# Patient Record
Sex: Male | Born: 1970 | Race: Black or African American | Hispanic: No | Marital: Single | Smoking: Never smoker
Health system: Southern US, Community
[De-identification: ages and names within clinical notes are randomized; demographics above are authoritative.]

## PROBLEM LIST (undated history)

## (undated) DIAGNOSIS — E119 Type 2 diabetes mellitus without complications: Secondary | ICD-10-CM

## (undated) DIAGNOSIS — F909 Attention-deficit hyperactivity disorder, unspecified type: Secondary | ICD-10-CM

## (undated) DIAGNOSIS — G4730 Sleep apnea, unspecified: Secondary | ICD-10-CM

## (undated) DIAGNOSIS — I10 Essential (primary) hypertension: Secondary | ICD-10-CM

## (undated) DIAGNOSIS — M109 Gout, unspecified: Secondary | ICD-10-CM

## (undated) DIAGNOSIS — F419 Anxiety disorder, unspecified: Secondary | ICD-10-CM

## (undated) HISTORY — DX: Sleep apnea, unspecified: G47.30

## (undated) HISTORY — PX: WISDOM TOOTH EXTRACTION: SHX21

## (undated) HISTORY — DX: Anxiety disorder, unspecified: F41.9

## (undated) HISTORY — PX: GASTRIC BYPASS: SHX52

---

## 1999-02-05 ENCOUNTER — Emergency Department (HOSPITAL_COMMUNITY): Admission: EM | Admit: 1999-02-05 | Discharge: 1999-02-05 | Payer: Self-pay | Admitting: *Deleted

## 2003-06-28 ENCOUNTER — Emergency Department (HOSPITAL_COMMUNITY): Admission: EM | Admit: 2003-06-28 | Discharge: 2003-06-28 | Payer: Self-pay | Admitting: Emergency Medicine

## 2011-05-28 DIAGNOSIS — F411 Generalized anxiety disorder: Secondary | ICD-10-CM | POA: Diagnosis not present

## 2011-05-28 DIAGNOSIS — F39 Unspecified mood [affective] disorder: Secondary | ICD-10-CM | POA: Diagnosis not present

## 2011-05-28 DIAGNOSIS — F988 Other specified behavioral and emotional disorders with onset usually occurring in childhood and adolescence: Secondary | ICD-10-CM | POA: Diagnosis not present

## 2011-07-09 DIAGNOSIS — F259 Schizoaffective disorder, unspecified: Secondary | ICD-10-CM | POA: Diagnosis not present

## 2011-07-09 DIAGNOSIS — F988 Other specified behavioral and emotional disorders with onset usually occurring in childhood and adolescence: Secondary | ICD-10-CM | POA: Diagnosis not present

## 2011-07-21 DIAGNOSIS — M545 Low back pain, unspecified: Secondary | ICD-10-CM | POA: Diagnosis not present

## 2011-07-21 DIAGNOSIS — K8020 Calculus of gallbladder without cholecystitis without obstruction: Secondary | ICD-10-CM | POA: Diagnosis not present

## 2011-07-21 DIAGNOSIS — E559 Vitamin D deficiency, unspecified: Secondary | ICD-10-CM | POA: Diagnosis not present

## 2011-07-21 DIAGNOSIS — G4733 Obstructive sleep apnea (adult) (pediatric): Secondary | ICD-10-CM | POA: Diagnosis not present

## 2011-07-21 DIAGNOSIS — F988 Other specified behavioral and emotional disorders with onset usually occurring in childhood and adolescence: Secondary | ICD-10-CM | POA: Diagnosis not present

## 2011-07-21 DIAGNOSIS — F329 Major depressive disorder, single episode, unspecified: Secondary | ICD-10-CM | POA: Diagnosis not present

## 2011-07-21 DIAGNOSIS — D518 Other vitamin B12 deficiency anemias: Secondary | ICD-10-CM | POA: Diagnosis not present

## 2011-07-21 DIAGNOSIS — I10 Essential (primary) hypertension: Secondary | ICD-10-CM | POA: Diagnosis not present

## 2011-08-12 DIAGNOSIS — F988 Other specified behavioral and emotional disorders with onset usually occurring in childhood and adolescence: Secondary | ICD-10-CM | POA: Diagnosis not present

## 2011-08-12 DIAGNOSIS — F331 Major depressive disorder, recurrent, moderate: Secondary | ICD-10-CM | POA: Diagnosis not present

## 2011-08-13 DIAGNOSIS — I10 Essential (primary) hypertension: Secondary | ICD-10-CM | POA: Diagnosis not present

## 2011-08-13 DIAGNOSIS — F988 Other specified behavioral and emotional disorders with onset usually occurring in childhood and adolescence: Secondary | ICD-10-CM | POA: Diagnosis not present

## 2011-08-13 DIAGNOSIS — F331 Major depressive disorder, recurrent, moderate: Secondary | ICD-10-CM | POA: Diagnosis not present

## 2011-09-10 DIAGNOSIS — F39 Unspecified mood [affective] disorder: Secondary | ICD-10-CM | POA: Diagnosis not present

## 2011-09-10 DIAGNOSIS — F909 Attention-deficit hyperactivity disorder, unspecified type: Secondary | ICD-10-CM | POA: Diagnosis not present

## 2011-09-16 DIAGNOSIS — J309 Allergic rhinitis, unspecified: Secondary | ICD-10-CM | POA: Diagnosis not present

## 2011-09-16 DIAGNOSIS — F329 Major depressive disorder, single episode, unspecified: Secondary | ICD-10-CM | POA: Diagnosis not present

## 2011-09-16 DIAGNOSIS — I10 Essential (primary) hypertension: Secondary | ICD-10-CM | POA: Diagnosis not present

## 2011-09-16 DIAGNOSIS — F909 Attention-deficit hyperactivity disorder, unspecified type: Secondary | ICD-10-CM | POA: Diagnosis not present

## 2011-09-18 DIAGNOSIS — F39 Unspecified mood [affective] disorder: Secondary | ICD-10-CM | POA: Diagnosis not present

## 2011-09-30 DIAGNOSIS — M549 Dorsalgia, unspecified: Secondary | ICD-10-CM | POA: Diagnosis not present

## 2011-09-30 DIAGNOSIS — E348 Other specified endocrine disorders: Secondary | ICD-10-CM | POA: Diagnosis not present

## 2011-09-30 DIAGNOSIS — M542 Cervicalgia: Secondary | ICD-10-CM | POA: Diagnosis not present

## 2011-09-30 DIAGNOSIS — F39 Unspecified mood [affective] disorder: Secondary | ICD-10-CM | POA: Diagnosis not present

## 2011-09-30 DIAGNOSIS — E669 Obesity, unspecified: Secondary | ICD-10-CM | POA: Diagnosis not present

## 2011-09-30 DIAGNOSIS — I10 Essential (primary) hypertension: Secondary | ICD-10-CM | POA: Diagnosis not present

## 2011-10-08 DIAGNOSIS — E559 Vitamin D deficiency, unspecified: Secondary | ICD-10-CM | POA: Diagnosis not present

## 2011-10-08 DIAGNOSIS — R609 Edema, unspecified: Secondary | ICD-10-CM | POA: Diagnosis not present

## 2011-10-08 DIAGNOSIS — F319 Bipolar disorder, unspecified: Secondary | ICD-10-CM | POA: Diagnosis not present

## 2011-10-08 DIAGNOSIS — M109 Gout, unspecified: Secondary | ICD-10-CM | POA: Diagnosis not present

## 2011-10-08 DIAGNOSIS — I10 Essential (primary) hypertension: Secondary | ICD-10-CM | POA: Diagnosis not present

## 2011-10-08 DIAGNOSIS — K8020 Calculus of gallbladder without cholecystitis without obstruction: Secondary | ICD-10-CM | POA: Diagnosis not present

## 2011-10-08 DIAGNOSIS — E291 Testicular hypofunction: Secondary | ICD-10-CM | POA: Diagnosis not present

## 2011-10-08 DIAGNOSIS — G4730 Sleep apnea, unspecified: Secondary | ICD-10-CM | POA: Diagnosis not present

## 2011-10-16 DIAGNOSIS — F39 Unspecified mood [affective] disorder: Secondary | ICD-10-CM | POA: Diagnosis not present

## 2011-10-16 DIAGNOSIS — F259 Schizoaffective disorder, unspecified: Secondary | ICD-10-CM | POA: Diagnosis not present

## 2011-10-21 DIAGNOSIS — F259 Schizoaffective disorder, unspecified: Secondary | ICD-10-CM | POA: Diagnosis not present

## 2011-10-21 DIAGNOSIS — F39 Unspecified mood [affective] disorder: Secondary | ICD-10-CM | POA: Diagnosis not present

## 2011-11-03 DIAGNOSIS — E559 Vitamin D deficiency, unspecified: Secondary | ICD-10-CM | POA: Diagnosis not present

## 2011-11-03 DIAGNOSIS — I10 Essential (primary) hypertension: Secondary | ICD-10-CM | POA: Diagnosis not present

## 2011-11-03 DIAGNOSIS — R404 Transient alteration of awareness: Secondary | ICD-10-CM | POA: Diagnosis not present

## 2011-11-03 DIAGNOSIS — G4730 Sleep apnea, unspecified: Secondary | ICD-10-CM | POA: Diagnosis not present

## 2011-11-03 DIAGNOSIS — F319 Bipolar disorder, unspecified: Secondary | ICD-10-CM | POA: Diagnosis not present

## 2011-11-03 DIAGNOSIS — F909 Attention-deficit hyperactivity disorder, unspecified type: Secondary | ICD-10-CM | POA: Diagnosis not present

## 2011-11-03 DIAGNOSIS — R609 Edema, unspecified: Secondary | ICD-10-CM | POA: Diagnosis not present

## 2011-11-17 DIAGNOSIS — F909 Attention-deficit hyperactivity disorder, unspecified type: Secondary | ICD-10-CM | POA: Diagnosis not present

## 2011-11-17 DIAGNOSIS — I10 Essential (primary) hypertension: Secondary | ICD-10-CM | POA: Diagnosis not present

## 2011-11-17 DIAGNOSIS — G4730 Sleep apnea, unspecified: Secondary | ICD-10-CM | POA: Diagnosis not present

## 2011-11-17 DIAGNOSIS — R5381 Other malaise: Secondary | ICD-10-CM | POA: Diagnosis not present

## 2011-11-17 DIAGNOSIS — E291 Testicular hypofunction: Secondary | ICD-10-CM | POA: Diagnosis not present

## 2011-11-17 DIAGNOSIS — E559 Vitamin D deficiency, unspecified: Secondary | ICD-10-CM | POA: Diagnosis not present

## 2011-11-17 DIAGNOSIS — R5383 Other fatigue: Secondary | ICD-10-CM | POA: Diagnosis not present

## 2011-11-28 DIAGNOSIS — F988 Other specified behavioral and emotional disorders with onset usually occurring in childhood and adolescence: Secondary | ICD-10-CM | POA: Diagnosis not present

## 2011-11-28 DIAGNOSIS — E291 Testicular hypofunction: Secondary | ICD-10-CM | POA: Diagnosis not present

## 2011-11-28 DIAGNOSIS — E559 Vitamin D deficiency, unspecified: Secondary | ICD-10-CM | POA: Diagnosis not present

## 2011-11-28 DIAGNOSIS — G4730 Sleep apnea, unspecified: Secondary | ICD-10-CM | POA: Diagnosis not present

## 2011-11-28 DIAGNOSIS — I10 Essential (primary) hypertension: Secondary | ICD-10-CM | POA: Diagnosis not present

## 2011-12-31 DIAGNOSIS — I10 Essential (primary) hypertension: Secondary | ICD-10-CM | POA: Diagnosis not present

## 2011-12-31 DIAGNOSIS — E559 Vitamin D deficiency, unspecified: Secondary | ICD-10-CM | POA: Diagnosis not present

## 2011-12-31 DIAGNOSIS — T465X1A Poisoning by other antihypertensive drugs, accidental (unintentional), initial encounter: Secondary | ICD-10-CM | POA: Diagnosis not present

## 2011-12-31 DIAGNOSIS — R351 Nocturia: Secondary | ICD-10-CM | POA: Diagnosis not present

## 2011-12-31 DIAGNOSIS — R609 Edema, unspecified: Secondary | ICD-10-CM | POA: Diagnosis not present

## 2011-12-31 DIAGNOSIS — F909 Attention-deficit hyperactivity disorder, unspecified type: Secondary | ICD-10-CM | POA: Diagnosis not present

## 2011-12-31 DIAGNOSIS — G4700 Insomnia, unspecified: Secondary | ICD-10-CM | POA: Diagnosis not present

## 2012-02-13 DIAGNOSIS — N3941 Urge incontinence: Secondary | ICD-10-CM | POA: Diagnosis not present

## 2012-02-13 DIAGNOSIS — N529 Male erectile dysfunction, unspecified: Secondary | ICD-10-CM | POA: Diagnosis not present

## 2012-03-11 DIAGNOSIS — E291 Testicular hypofunction: Secondary | ICD-10-CM | POA: Diagnosis not present

## 2012-03-11 DIAGNOSIS — R350 Frequency of micturition: Secondary | ICD-10-CM | POA: Diagnosis not present

## 2012-03-11 DIAGNOSIS — R809 Proteinuria, unspecified: Secondary | ICD-10-CM | POA: Diagnosis not present

## 2012-03-11 DIAGNOSIS — K912 Postsurgical malabsorption, not elsewhere classified: Secondary | ICD-10-CM | POA: Diagnosis not present

## 2012-04-08 DIAGNOSIS — R809 Proteinuria, unspecified: Secondary | ICD-10-CM | POA: Diagnosis not present

## 2012-04-08 DIAGNOSIS — I10 Essential (primary) hypertension: Secondary | ICD-10-CM | POA: Diagnosis not present

## 2012-04-19 DIAGNOSIS — I10 Essential (primary) hypertension: Secondary | ICD-10-CM | POA: Diagnosis not present

## 2012-04-19 DIAGNOSIS — R809 Proteinuria, unspecified: Secondary | ICD-10-CM | POA: Diagnosis not present

## 2012-04-23 DIAGNOSIS — I10 Essential (primary) hypertension: Secondary | ICD-10-CM | POA: Diagnosis not present

## 2012-04-23 DIAGNOSIS — R809 Proteinuria, unspecified: Secondary | ICD-10-CM | POA: Diagnosis not present

## 2012-05-07 DIAGNOSIS — N181 Chronic kidney disease, stage 1: Secondary | ICD-10-CM | POA: Diagnosis not present

## 2012-05-07 DIAGNOSIS — I10 Essential (primary) hypertension: Secondary | ICD-10-CM | POA: Diagnosis not present

## 2012-06-03 DIAGNOSIS — R350 Frequency of micturition: Secondary | ICD-10-CM | POA: Diagnosis not present

## 2012-06-03 DIAGNOSIS — F411 Generalized anxiety disorder: Secondary | ICD-10-CM | POA: Diagnosis not present

## 2012-06-03 DIAGNOSIS — F319 Bipolar disorder, unspecified: Secondary | ICD-10-CM | POA: Diagnosis not present

## 2012-06-03 DIAGNOSIS — N181 Chronic kidney disease, stage 1: Secondary | ICD-10-CM | POA: Diagnosis not present

## 2012-06-03 DIAGNOSIS — R82998 Other abnormal findings in urine: Secondary | ICD-10-CM | POA: Diagnosis not present

## 2012-06-03 DIAGNOSIS — J309 Allergic rhinitis, unspecified: Secondary | ICD-10-CM | POA: Diagnosis not present

## 2012-06-03 DIAGNOSIS — Z9884 Bariatric surgery status: Secondary | ICD-10-CM | POA: Diagnosis not present

## 2012-06-03 DIAGNOSIS — F909 Attention-deficit hyperactivity disorder, unspecified type: Secondary | ICD-10-CM | POA: Diagnosis not present

## 2012-06-03 DIAGNOSIS — I10 Essential (primary) hypertension: Secondary | ICD-10-CM | POA: Diagnosis not present

## 2012-06-03 DIAGNOSIS — M109 Gout, unspecified: Secondary | ICD-10-CM | POA: Diagnosis not present

## 2012-06-07 DIAGNOSIS — F259 Schizoaffective disorder, unspecified: Secondary | ICD-10-CM | POA: Diagnosis not present

## 2012-06-17 DIAGNOSIS — Z9884 Bariatric surgery status: Secondary | ICD-10-CM | POA: Diagnosis not present

## 2012-06-17 DIAGNOSIS — R809 Proteinuria, unspecified: Secondary | ICD-10-CM | POA: Diagnosis not present

## 2012-06-17 DIAGNOSIS — R82998 Other abnormal findings in urine: Secondary | ICD-10-CM | POA: Diagnosis not present

## 2012-06-17 DIAGNOSIS — N181 Chronic kidney disease, stage 1: Secondary | ICD-10-CM | POA: Diagnosis not present

## 2012-06-17 DIAGNOSIS — I10 Essential (primary) hypertension: Secondary | ICD-10-CM | POA: Diagnosis not present

## 2012-06-17 DIAGNOSIS — F259 Schizoaffective disorder, unspecified: Secondary | ICD-10-CM | POA: Diagnosis not present

## 2012-06-17 DIAGNOSIS — J309 Allergic rhinitis, unspecified: Secondary | ICD-10-CM | POA: Diagnosis not present

## 2012-06-17 DIAGNOSIS — F909 Attention-deficit hyperactivity disorder, unspecified type: Secondary | ICD-10-CM | POA: Diagnosis not present

## 2012-06-17 DIAGNOSIS — F319 Bipolar disorder, unspecified: Secondary | ICD-10-CM | POA: Diagnosis not present

## 2012-06-17 DIAGNOSIS — M109 Gout, unspecified: Secondary | ICD-10-CM | POA: Diagnosis not present

## 2012-06-17 DIAGNOSIS — F411 Generalized anxiety disorder: Secondary | ICD-10-CM | POA: Diagnosis not present

## 2012-07-05 DIAGNOSIS — Z79899 Other long term (current) drug therapy: Secondary | ICD-10-CM | POA: Diagnosis not present

## 2012-07-05 DIAGNOSIS — N189 Chronic kidney disease, unspecified: Secondary | ICD-10-CM | POA: Diagnosis not present

## 2012-07-05 DIAGNOSIS — M839 Adult osteomalacia, unspecified: Secondary | ICD-10-CM | POA: Diagnosis not present

## 2012-07-05 DIAGNOSIS — I10 Essential (primary) hypertension: Secondary | ICD-10-CM | POA: Diagnosis not present

## 2012-07-05 DIAGNOSIS — F909 Attention-deficit hyperactivity disorder, unspecified type: Secondary | ICD-10-CM | POA: Diagnosis not present

## 2012-07-05 DIAGNOSIS — K759 Inflammatory liver disease, unspecified: Secondary | ICD-10-CM | POA: Diagnosis not present

## 2012-07-05 DIAGNOSIS — F3110 Bipolar disorder, current episode manic without psychotic features, unspecified: Secondary | ICD-10-CM | POA: Diagnosis not present

## 2012-07-19 DIAGNOSIS — F4310 Post-traumatic stress disorder, unspecified: Secondary | ICD-10-CM | POA: Diagnosis not present

## 2012-07-19 DIAGNOSIS — F319 Bipolar disorder, unspecified: Secondary | ICD-10-CM | POA: Diagnosis not present

## 2012-07-26 DIAGNOSIS — R51 Headache: Secondary | ICD-10-CM | POA: Diagnosis not present

## 2012-07-26 DIAGNOSIS — F319 Bipolar disorder, unspecified: Secondary | ICD-10-CM | POA: Diagnosis not present

## 2012-07-26 DIAGNOSIS — R110 Nausea: Secondary | ICD-10-CM | POA: Diagnosis not present

## 2012-07-26 DIAGNOSIS — F22 Delusional disorders: Secondary | ICD-10-CM | POA: Diagnosis not present

## 2012-07-26 DIAGNOSIS — I10 Essential (primary) hypertension: Secondary | ICD-10-CM | POA: Diagnosis not present

## 2012-07-26 DIAGNOSIS — Z79899 Other long term (current) drug therapy: Secondary | ICD-10-CM | POA: Diagnosis not present

## 2012-07-26 DIAGNOSIS — F4310 Post-traumatic stress disorder, unspecified: Secondary | ICD-10-CM | POA: Diagnosis not present

## 2012-08-02 DIAGNOSIS — F319 Bipolar disorder, unspecified: Secondary | ICD-10-CM | POA: Diagnosis not present

## 2012-08-02 DIAGNOSIS — I10 Essential (primary) hypertension: Secondary | ICD-10-CM | POA: Diagnosis not present

## 2012-11-09 DIAGNOSIS — E559 Vitamin D deficiency, unspecified: Secondary | ICD-10-CM | POA: Diagnosis not present

## 2012-11-09 DIAGNOSIS — Z136 Encounter for screening for cardiovascular disorders: Secondary | ICD-10-CM | POA: Diagnosis not present

## 2012-11-09 DIAGNOSIS — F411 Generalized anxiety disorder: Secondary | ICD-10-CM | POA: Diagnosis not present

## 2012-11-09 DIAGNOSIS — Z1329 Encounter for screening for other suspected endocrine disorder: Secondary | ICD-10-CM | POA: Diagnosis not present

## 2012-11-09 DIAGNOSIS — I119 Hypertensive heart disease without heart failure: Secondary | ICD-10-CM | POA: Diagnosis not present

## 2012-11-09 DIAGNOSIS — N318 Other neuromuscular dysfunction of bladder: Secondary | ICD-10-CM | POA: Diagnosis not present

## 2012-11-09 DIAGNOSIS — F4323 Adjustment disorder with mixed anxiety and depressed mood: Secondary | ICD-10-CM | POA: Diagnosis not present

## 2012-11-09 DIAGNOSIS — F909 Attention-deficit hyperactivity disorder, unspecified type: Secondary | ICD-10-CM | POA: Diagnosis not present

## 2012-11-09 DIAGNOSIS — Z131 Encounter for screening for diabetes mellitus: Secondary | ICD-10-CM | POA: Diagnosis not present

## 2013-04-27 ENCOUNTER — Encounter (HOSPITAL_COMMUNITY): Payer: Self-pay | Admitting: Emergency Medicine

## 2013-04-27 ENCOUNTER — Emergency Department (HOSPITAL_COMMUNITY)
Admission: EM | Admit: 2013-04-27 | Discharge: 2013-04-27 | Disposition: A | Discharge status: Home / Self Care | Payer: Medicare Other | Attending: Emergency Medicine | Admitting: Emergency Medicine

## 2013-04-27 DIAGNOSIS — M25439 Effusion, unspecified wrist: Secondary | ICD-10-CM | POA: Insufficient documentation

## 2013-04-27 DIAGNOSIS — M109 Gout, unspecified: Secondary | ICD-10-CM | POA: Insufficient documentation

## 2013-04-27 DIAGNOSIS — Z9114 Patient's other noncompliance with medication regimen: Secondary | ICD-10-CM

## 2013-04-27 DIAGNOSIS — Z8659 Personal history of other mental and behavioral disorders: Secondary | ICD-10-CM | POA: Insufficient documentation

## 2013-04-27 DIAGNOSIS — R350 Frequency of micturition: Secondary | ICD-10-CM | POA: Insufficient documentation

## 2013-04-27 DIAGNOSIS — Z9119 Patient's noncompliance with other medical treatment and regimen: Secondary | ICD-10-CM | POA: Insufficient documentation

## 2013-04-27 DIAGNOSIS — Z91199 Patient's noncompliance with other medical treatment and regimen due to unspecified reason: Secondary | ICD-10-CM | POA: Insufficient documentation

## 2013-04-27 DIAGNOSIS — I10 Essential (primary) hypertension: Secondary | ICD-10-CM | POA: Insufficient documentation

## 2013-04-27 DIAGNOSIS — R51 Headache: Secondary | ICD-10-CM | POA: Insufficient documentation

## 2013-04-27 HISTORY — DX: Essential (primary) hypertension: I10

## 2013-04-27 HISTORY — DX: Attention-deficit hyperactivity disorder, unspecified type: F90.9

## 2013-04-27 HISTORY — DX: Gout, unspecified: M10.9

## 2013-04-27 HISTORY — DX: Type 2 diabetes mellitus without complications: E11.9

## 2013-04-27 LAB — GLUCOSE, CAPILLARY: Glucose-Capillary: 104 mg/dL — ABNORMAL HIGH (ref 70–99)

## 2013-04-27 MED ORDER — OXYCODONE-ACETAMINOPHEN 5-325 MG PO TABS: 2.0000 | ORAL_TABLET | ORAL | Status: DC | PRN

## 2013-04-27 MED ORDER — COLCHICINE 0.6 MG PO TABS: ORAL_TABLET | ORAL | Status: DC

## 2013-04-27 NOTE — ED Notes (Signed)
Pt reports ran out of adderall approx 1 week ago.  C/O headaches, dry mouth, frequent urination.  Also c/o gout in left hand x 2 days.

## 2013-04-27 NOTE — ED Provider Notes (Signed)
CSN: [id number]     Arrival date & time 04/27/13  4098 History  This chart was scribed for Charles B. Bernette Mayers, MD, by Yevette Edwards, ED Scribe. This patient was seen in room APA04/APA04 and the patient's care was started at 9:44 AM.  None    No chief complaint on file.  The history is provided by the patient. No language interpreter was used.   HPI Comments: Julian Richardson is a 42 y.o. Male, with a h/o gout, HTN, and ADHD, who presents to the Emergency Department complaining of gradually-increasing pain to his left hand which began four days ago and which has been accompanied by swelling. The pt reports he has experienced gout to same location previously. His last gout flare-up was approximately two years ago. He denies any recent falls or injuries. Called PCP and given Rx for Allopurinol 2 days ago.  The pt also reports his ADHD medications have recently been changed by his Psychiatrist.  His prescription for short-acting adderall, which he was given at the beginning of December, ran out last week, but his pharmacy informed him he could not refill the medication until January 8th. The pt reports he was provided a month's prescription, but he took extra doses because he believed the medication was not working well. He states he has also experienced a headache, dry mouth, and urinary frequency, and he questions if any of his medications are related to the symptoms. The pt denies a fever and emesis. He is a non-smoker.   No past medical history on file. No past surgical history on file. No family history on file. History  Substance Use Topics  . Smoking status: Not on file  . Smokeless tobacco: Not on file  . Alcohol Use: Not on file    Review of Systems  A complete 10 system review of systems was obtained, and all systems were negative except where indicated in the HPI and PE.   Allergies  Review of patient's allergies indicates not on file.  Home Medications  No current outpatient  prescriptions on file.  Triage Vitals: BP 180/100  Pulse 85  Temp(Src) 98.2 F (36.8 C) (Oral)  Resp 18  Ht 5\' 11"  (1.803 m)  Wt 349 lb (158.305 kg)  BMI 48.70 kg/m2  SpO2 99%  Physical Exam  Nursing note and vitals reviewed. Constitutional: He is oriented to person, place, and time. He appears well-developed and well-nourished.  HENT:  Head: Normocephalic and atraumatic.  Dry mouth.   Eyes: EOM are normal. Pupils are equal, round, and reactive to light.  Neck: Normal range of motion. Neck supple.  Cardiovascular: Normal rate, normal heart sounds and intact distal pulses.   Pulmonary/Chest: Effort normal and breath sounds normal.  Abdominal: Bowel sounds are normal. He exhibits no distension. There is no tenderness.  Musculoskeletal: Normal range of motion. He exhibits no edema and no tenderness.  Left hand and wrist swollen, warm and tender, worse with movement.  Neurological: He is alert and oriented to person, place, and time. He has normal strength. No cranial nerve deficit or sensory deficit.  Skin: Skin is warm and dry. No rash noted.  Psychiatric: He has a normal mood and affect.    ED Course  Procedures (including critical care time)  DIAGNOSTIC STUDIES: Oxygen Saturation is 99% on room air, normal by my interpretation.    COORDINATION OF CARE:  9:52 AM- Discussed treatment plan with patient, and the patient agreed to the plan.   Labs Review Labs Reviewed -  No data to display Imaging Review No results found.  EKG Interpretation   None       MDM   1. Gout attack   2. Non compliance w medication regimen    Pt advised to hold Allopurinol while having an acute gout flare. Rx Colchicine and Percocet. Advised to discuss ADHD meds with Psychiatrist.   I personally performed the services described in this documentation, which was scribed in my presence. The recorded information has been reviewed and is accurate.      Charles B. Bernette Mayers, MD 04/27/13  1017

## 2013-04-27 NOTE — ED Notes (Signed)
Patient with no complaints at this time. Respirations even and unlabored. Skin warm/dry. Discharge instructions reviewed with patient at this time. Patient given opportunity to voice concerns/ask questions. Patient discharged at this time and left Emergency Department with steady gait.   

## 2013-05-05 DIAGNOSIS — F332 Major depressive disorder, recurrent severe without psychotic features: Secondary | ICD-10-CM | POA: Diagnosis not present

## 2013-06-03 ENCOUNTER — Encounter (HOSPITAL_COMMUNITY): Payer: Self-pay | Admitting: Emergency Medicine

## 2013-06-03 ENCOUNTER — Emergency Department (HOSPITAL_COMMUNITY)
Admission: EM | Admit: 2013-06-03 | Discharge: 2013-06-03 | Disposition: A | Discharge status: Home / Self Care | Payer: Medicare Other | Attending: Emergency Medicine | Admitting: Emergency Medicine

## 2013-06-03 DIAGNOSIS — Y929 Unspecified place or not applicable: Secondary | ICD-10-CM | POA: Insufficient documentation

## 2013-06-03 DIAGNOSIS — X131XXA Other contact with steam and other hot vapors, initial encounter: Secondary | ICD-10-CM

## 2013-06-03 DIAGNOSIS — E119 Type 2 diabetes mellitus without complications: Secondary | ICD-10-CM | POA: Insufficient documentation

## 2013-06-03 DIAGNOSIS — Y939 Activity, unspecified: Secondary | ICD-10-CM | POA: Insufficient documentation

## 2013-06-03 DIAGNOSIS — M2550 Pain in unspecified joint: Secondary | ICD-10-CM | POA: Insufficient documentation

## 2013-06-03 DIAGNOSIS — I10 Essential (primary) hypertension: Secondary | ICD-10-CM | POA: Insufficient documentation

## 2013-06-03 DIAGNOSIS — Z79899 Other long term (current) drug therapy: Secondary | ICD-10-CM | POA: Insufficient documentation

## 2013-06-03 DIAGNOSIS — M109 Gout, unspecified: Secondary | ICD-10-CM | POA: Insufficient documentation

## 2013-06-03 DIAGNOSIS — T23229A Burn of second degree of unspecified single finger (nail) except thumb, initial encounter: Secondary | ICD-10-CM

## 2013-06-03 DIAGNOSIS — X12XXXA Contact with other hot fluids, initial encounter: Secondary | ICD-10-CM | POA: Insufficient documentation

## 2013-06-03 DIAGNOSIS — T23239A Burn of second degree of unspecified multiple fingers (nail), not including thumb, initial encounter: Secondary | ICD-10-CM | POA: Insufficient documentation

## 2013-06-03 DIAGNOSIS — Z8659 Personal history of other mental and behavioral disorders: Secondary | ICD-10-CM | POA: Insufficient documentation

## 2013-06-03 LAB — GLUCOSE, CAPILLARY: Glucose-Capillary: 113 mg/dL — ABNORMAL HIGH (ref 70–99)

## 2013-06-03 MED ORDER — SILVER SULFADIAZINE 1 % EX CREA
TOPICAL_CREAM | Freq: Once | CUTANEOUS | Status: AC
  Administered 2013-06-03: 12:00:00 via TOPICAL
  Filled 2013-06-03: qty 50

## 2013-06-03 MED ORDER — OXYCODONE-ACETAMINOPHEN 5-325 MG PO TABS
1.0000 | ORAL_TABLET | Freq: Once | ORAL | Status: AC
  Administered 2013-06-03: 1 via ORAL
  Filled 2013-06-03: qty 1

## 2013-06-03 MED ORDER — OXYCODONE-ACETAMINOPHEN 5-325 MG PO TABS: 2.0000 | ORAL_TABLET | Freq: Four times a day (QID) | ORAL | Status: DC | PRN
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
## 2013-06-03 NOTE — Discharge Instructions (Signed)
Second-Degree Burn A second-degree burn affects the 2 outer layers of skin. The outer layer (epidermis) and the layer underneath it (dermis) are both burned. Another name for this type of burn is a partial thickness burn. A second-degree burn may be called minor or major. This depends on the size of the burn. It also depends on what parts of the skin are burned. Minor burns may be treated with first aid. Major burns are a medical emergency. A second-degree burn is worse than a first-degree burn, but not as bad as a third-degree burn. A first-degree burn affects only the epidermis. A third-degree burn goes through all the layers of skin. A second-degree burn usually heals in 3 to 4 weeks. A minor second-degree burn usually does not leave a scar.Deeper second-degree burns may lead to scarring of the skin or contractures over joints.Contractures are scars that form over joints and may lead to reduced mobility at those joints. CAUSES  Heat (thermal) injury. This happens when skin comes in contact with something very hot. It could be a flame, a hot object, hot liquid, or steam. Most second-degree burns are thermal injuries.  Radiation. Sunlight is one type of radiation that can burn the skin. Another type of radiation is used to heat food. Radiation is also used to treat some diseases, such as cancer. All types of radiation can burn the skin. Sunlight usually causes a first-degree burn. Radiation used for heating food or treating a disease can cause a second-degree burn.  Electricity. Electrical burns can cause more damage under the skin than on the surface. They should always be treated as major burns.  Chemicals. Many chemicals can burn the skin. The burn should be flushed with cool water and checked by an emergency caregiver. SYMPTOMS Symptoms of second-degree burns include:  Severe pain.  Extreme tenderness.  Deep redness.  Blistered skin.  Skin that has changed color.It might look blotchy,  wet, or shiny.  Swelling. TREATMENT Some second-degree burns may need to be treated in a hospital. These include major burns, electrical burns, and chemical burns. Many other second-degree burns can be treated with regular first aid, such as:  Cooling the burn. Use cool, germ-free (sterile) salt water. Place the burned area of skin into a tub of water, or cover the burned area with clean, wet towels.  Taking pain medicine.  Removing the dead skin from broken blisters. A trained caregiver may do this. Do not pop blisters.  Gently washing your skin with mild soap.  Covering the burned area with a cream.Silver sulfadiazine is a cream for burns. An antibiotic cream, such as bacitracin, may also be used to fight infection. Do not use other ointments or creams unless your caregiver says it is okay.  Protecting the burn with a sterile, non-sticky bandage.  Bandaging fingers and toes separately. This keeps them from sticking together.  Taking an antibiotic. This can help prevent infection.  Getting a tetanus shot. HOME CARE INSTRUCTIONS Medication  Take any medicine prescribed by your caregiver. Follow the directions carefully.  Ask your caregiver if you can take over-the-counter medicine to relieve pain and swelling. Do not give aspirin to children.  Make sure your caregiver knows about all other medicines you take.This includes over-the-counter medicines. Burn care  You will need to change the bandage on your burn. You may need to do this 2 or 3 times each day.  Gently clean the burned area.  Put ointment on it.  Cover the burn with a sterile bandage.    For some deeper burns or burns that cover a large area, compression garments may be prescribed. These garments can help minimize scarring and protect your mobility.  Do not put butter or oil on your skin. Use only the cream prescribed by your caregiver.  Do not put ice on your burn.  Do not break blisters on your  skin.  Keep the bandaged area dry. You might need to take a sponge bath for awhile.Ask your caregiver when you can take a shower or a tub bath again.  Do not scratch an itchy burn. Your caregiver may give you medicine to relieve very bad itching.  Infection is a big danger after a second-degree burn. Tell your caregiver right away if you have signs of infection, such as:  Redness or changing color in the burned area.  Fluid leaking from the burn.  Swelling in the burn area.  A bad smell coming from the wound. Follow-up  Keep all follow-up appointments.This is important. This is how your caregiver can tell if your treatment is working.  Protect your burn from sunlight.Use sunscreen whenever you go outside.Burned areas may be sensitive to the sun for up to 1 year. Exposure to the sun may also cause permanent darkening of scars. SEEK MEDICAL CARE IF:  You have any questions about medicines.  You have any questions about your treatment.  You wonder if it is okay to do a particular activity.  You develop a fever of more than 100.5 F (38.1 C). SEEK IMMEDIATE MEDICAL CARE IF:  You think your burn might be infected. It may change color, become red, leak fluid, swell, or smell bad.  You develop a fever of more than 102 F (38.9 C). Document Released: 09/16/2010 Document Revised: 07/07/2011 Document Reviewed: 09/16/2010 [employer] Patient Information 2014 [employer].  

## 2013-06-04 NOTE — ED Provider Notes (Signed)
CSN: [id number]     Arrival date & time 06/03/13  1029 History   First MD Initiated Contact with Patient 06/03/13 1056     Chief Complaint  Patient presents with  . Burn   (Consider location/radiation/quality/duration/timing/severity/associated sxs/prior Treatment) Patient is a 43 y.o. male presenting with burn. The history is provided by the patient.  Burn Burn location:  Finger Finger burn location:  R long finger and R ring finger Burn quality:  Intact blister and painful Time since incident:  6 days Progression:  Unchanged Pain details:    Severity:  Moderate   Timing:  Constant   Progression:  Unchanged Mechanism of burn:  Hot liquid Incident location:  Kitchen and home Relieved by:  Nothing Worsened by:  Rubbing, tactile pressure and movement Ineffective treatments:  NSAIDs and acetaminophen Associated symptoms: no cough, no difficulty swallowing, no eye pain, no nasal burns and no shortness of breath   Tetanus status:  Up to date (last Td was 2011)   Past Medical History  Diagnosis Date  . Diabetes mellitus without complication   . Gout   . ADHD (attention deficit hyperactivity disorder)   . Hypertension    Past Surgical History  Procedure Laterality Date  . Gastric bypass     Family History  Problem Relation Age of Onset  . Diabetes Mother    History  Substance Use Topics  . Smoking status: Never Smoker   . Smokeless tobacco: Never Used  . Alcohol Use: No    Review of Systems  Constitutional: Negative for fever and chills.  HENT: Negative for trouble swallowing.   Eyes: Negative for pain.  Respiratory: Negative for cough and shortness of breath.   Genitourinary: Negative for dysuria and difficulty urinating.  Musculoskeletal: Positive for arthralgias. Negative for joint swelling.  Skin: Negative for color change and wound.       Burn to right middle and ring finger with intact blister to the middle finger  All other systems reviewed and are  negative.    Allergies  Nsaids  Home Medications   Current Outpatient Rx  Name  Route  Sig  Dispense  Refill  . acetaminophen (TYLENOL) 500 MG tablet   Oral   Take 1,500 mg by mouth every 6 (six) hours as needed for moderate pain.         . clonazePAM (KLONOPIN) 1 MG tablet   Oral   Take 0.5-2 mg by mouth. 1/2 tablet at 6 pm and 2 tablets at bedtime.         . colchicine 0.6 MG tablet      Take 2 tablets by mouth followed by 1 additional tablet one hour later. Do NOT exceed 3 tablets per gout attack   6 tablet   0   . gabapentin (NEURONTIN) 800 MG tablet   Oral   Take 400-1,600 mg by mouth 2 (two) times daily. 1/2 tablet every morning and 2 tablets every night at bedtime.         . Garlic 1000 MG CAPS   Oral   Take 1 capsule by mouth daily.         . Iodine, Kelp, (KELP PO)   Oral   Take 1 capsule by mouth daily as needed (for energy).         . Lactobacillus (ULTIMATE PROBIOTIC FORMULA PO)   Oral   Take 1 capsule by mouth daily as needed (immune system).         Marland Kitchen loratadine (CLARITIN)  10 MG tablet   Oral   Take 10 mg by mouth daily as needed for allergies.         . Misc Natural Products (COLON HERBAL CLEANSER) CAPS   Oral   Take 2 capsules by mouth daily.         Marland Kitchen. omeprazole (PRILOSEC) 40 MG capsule   Oral   Take 40 mg by mouth daily.         Marland Kitchen. oxyCODONE-acetaminophen (PERCOCET/ROXICET) 5-325 MG per tablet   Oral   Take 2 tablets by mouth every 4 (four) hours as needed for severe pain.   15 tablet   0   . oxyCODONE-acetaminophen (PERCOCET/ROXICET) 5-325 MG per tablet   Oral   Take 2 tablets by mouth every 6 (six) hours as needed for severe pain.   20 tablet   0   . venlafaxine XR (EFFEXOR-XR) 150 MG 24 hr capsule   Oral   Take 150 mg by mouth daily.         . vitamin E 400 UNIT capsule   Oral   Take 800 Units by mouth daily.          BP 158/103  Pulse 101  Temp(Src) 98.3 F (36.8 C) (Oral)  Resp 18  Ht 5\' 11"   (1.803 m)  Wt 358 lb (162.388 kg)  BMI 49.95 kg/m2  SpO2 99% Physical Exam  Nursing note and vitals reviewed. Constitutional: He is oriented to person, place, and time. He appears well-developed and well-nourished. No distress.  HENT:  Head: Normocephalic and atraumatic.  Cardiovascular: Normal rate, regular rhythm, normal heart sounds and intact distal pulses.   No murmur heard. Pulmonary/Chest: Effort normal and breath sounds normal.  Musculoskeletal: He exhibits tenderness. He exhibits no edema.       Right hand: He exhibits tenderness. He exhibits no bony tenderness, normal two-point discrimination, normal capillary refill, no deformity and no laceration. Normal sensation noted. Normal strength noted.       Hands: Second degree burn to dorsal right middle and ring fingers with large, intact blister dorsal surface of the middle finger.  No surrounding erythema.  Radial pulse is brisk, distal sensation intact.  CR< 2 sec.  No  bony deformity.  Patient able to flex finger slightly , has full extension.    Neurological: He is alert and oriented to person, place, and time. He exhibits normal muscle tone. Coordination normal.  Skin: Skin is warm and dry.    ED Course  Procedures (including critical care time) Labs Review Labs Reviewed  GLUCOSE, CAPILLARY - Abnormal; Notable for the following:    Glucose-Capillary 113 (*)    All other components within normal limits   Imaging Review No results found.  EKG Interpretation   None       MDM   1. Second degree burn of finger    Pt has a second degree burn to the right middle finger with a large , intact blister present to the dorsal surface.  No clinical signs of infection.  Burn was bandaged and silvadene cream applied.  I will arrange pt to have treatment with rehab dept here and he has [facility].      Heyden Jaber L. Trisha Mangleriplett, PA-C 06/04/13 2101

## 2013-06-06 NOTE — ED Provider Notes (Signed)
Medical screening examination/treatment/procedure(s) were performed by non-physician practitioner and as supervising physician I was immediately available for consultation/collaboration.  EKG Interpretation   None         Riordan Walle W Kameo Bains, MD 06/06/13 2214 

## 2013-06-14 ENCOUNTER — Ambulatory Visit (HOSPITAL_COMMUNITY): Payer: Medicare Other | Admitting: Physical Therapy

## 2013-06-15 ENCOUNTER — Telehealth (HOSPITAL_COMMUNITY): Payer: Self-pay

## 2013-07-28 DIAGNOSIS — F332 Major depressive disorder, recurrent severe without psychotic features: Secondary | ICD-10-CM | POA: Diagnosis not present

## 2013-10-28 ENCOUNTER — Emergency Department (HOSPITAL_COMMUNITY): Payer: Medicare Other

## 2013-10-28 ENCOUNTER — Encounter (HOSPITAL_COMMUNITY): Payer: Self-pay | Admitting: Emergency Medicine

## 2013-10-28 ENCOUNTER — Emergency Department (HOSPITAL_COMMUNITY)
Admission: EM | Admit: 2013-10-28 | Discharge: 2013-10-28 | Disposition: A | Discharge status: Home / Self Care | Payer: Medicare Other | Attending: Emergency Medicine | Admitting: Emergency Medicine

## 2013-10-28 DIAGNOSIS — R059 Cough, unspecified: Secondary | ICD-10-CM | POA: Insufficient documentation

## 2013-10-28 DIAGNOSIS — R05 Cough: Secondary | ICD-10-CM | POA: Insufficient documentation

## 2013-10-28 DIAGNOSIS — R109 Unspecified abdominal pain: Secondary | ICD-10-CM | POA: Diagnosis not present

## 2013-10-28 DIAGNOSIS — E119 Type 2 diabetes mellitus without complications: Secondary | ICD-10-CM | POA: Insufficient documentation

## 2013-10-28 DIAGNOSIS — Z8659 Personal history of other mental and behavioral disorders: Secondary | ICD-10-CM | POA: Insufficient documentation

## 2013-10-28 DIAGNOSIS — L03115 Cellulitis of right lower limb: Secondary | ICD-10-CM

## 2013-10-28 DIAGNOSIS — M7989 Other specified soft tissue disorders: Secondary | ICD-10-CM | POA: Diagnosis present

## 2013-10-28 DIAGNOSIS — E669 Obesity, unspecified: Secondary | ICD-10-CM | POA: Diagnosis not present

## 2013-10-28 DIAGNOSIS — M109 Gout, unspecified: Secondary | ICD-10-CM | POA: Diagnosis not present

## 2013-10-28 DIAGNOSIS — L03119 Cellulitis of unspecified part of limb: Principal | ICD-10-CM

## 2013-10-28 DIAGNOSIS — Z79899 Other long term (current) drug therapy: Secondary | ICD-10-CM | POA: Diagnosis not present

## 2013-10-28 DIAGNOSIS — L02419 Cutaneous abscess of limb, unspecified: Secondary | ICD-10-CM | POA: Diagnosis not present

## 2013-10-28 DIAGNOSIS — I10 Essential (primary) hypertension: Secondary | ICD-10-CM | POA: Diagnosis not present

## 2013-10-28 LAB — BASIC METABOLIC PANEL
Anion gap: 10 (ref 5–15)
BUN: 10 mg/dL (ref 6–23)
CO2: 29 mEq/L (ref 19–32)
Calcium: 8.7 mg/dL (ref 8.4–10.5)
Chloride: 105 mEq/L (ref 96–112)
Creatinine, Ser: 0.95 mg/dL (ref 0.50–1.35)
GFR calc Af Amer: >90 mL/min (ref >90)
GFR calc non Af Amer: >90 mL/min (ref >90)
Glucose, Bld: 122 mg/dL — ABNORMAL HIGH (ref 70–99)
Potassium: 4 mEq/L (ref 3.7–5.3)
Sodium: 144 mEq/L (ref 137–147)

## 2013-10-28 LAB — CBC WITH DIFFERENTIAL/PLATELET
Basophils Absolute: 0 10*3/uL (ref 0.0–0.1)
Basophils Relative: 0 % (ref 0–1)
Eosinophils Absolute: 0.6 10*3/uL (ref 0.0–0.7)
Eosinophils Relative: 10 % — ABNORMAL HIGH (ref 0–5)
HCT: 40.8 % (ref 39.0–52.0)
Hemoglobin: 13.5 g/dL (ref 13.0–17.0)
Lymphocytes Relative: 26 % (ref 12–46)
Lymphs Abs: 1.7 10*3/uL (ref 0.7–4.0)
MCH: 29 pg (ref 26.0–34.0)
MCHC: 33.1 g/dL (ref 30.0–36.0)
MCV: 87.6 fL (ref 78.0–100.0)
Monocytes Absolute: 0.6 10*3/uL (ref 0.1–1.0)
Monocytes Relative: 9 % (ref 3–12)
Neutro Abs: 3.6 10*3/uL (ref 1.7–7.7)
Neutrophils Relative %: 55 % (ref 43–77)
Platelets: 241 10*3/uL (ref 150–400)
RBC: 4.66 MIL/uL (ref 4.22–5.81)
RDW: 15 % (ref 11.5–15.5)
WBC: 6.6 10*3/uL (ref 4.0–10.5)

## 2013-10-28 MED ORDER — OXYCODONE-ACETAMINOPHEN 5-325 MG PO TABS: 1.0000 | ORAL_TABLET | ORAL | Status: DC | PRN

## 2013-10-28 MED ORDER — CLINDAMYCIN HCL 300 MG PO CAPS: 300.0000 mg | ORAL_CAPSULE | Freq: Four times a day (QID) | ORAL | Status: DC

## 2013-10-28 MED ORDER — CLINDAMYCIN HCL 150 MG PO CAPS
300.0000 mg | ORAL_CAPSULE | Freq: Once | ORAL | Status: AC
  Administered 2013-10-28: 300 mg via ORAL
  Filled 2013-10-28: qty 2

## 2013-10-28 MED ORDER — CLINDAMYCIN HCL 150 MG PO CAPS
ORAL_CAPSULE | ORAL | Status: AC
  Filled 2013-10-28: qty 1

## 2013-10-28 MED ORDER — OXYCODONE-ACETAMINOPHEN 5-325 MG PO TABS
2.0000 | ORAL_TABLET | Freq: Once | ORAL | Status: AC
  Administered 2013-10-28: 2 via ORAL
  Filled 2013-10-28: qty 2

## 2013-10-28 NOTE — ED Notes (Addendum)
Ultrasound into room at this time

## 2013-10-28 NOTE — ED Provider Notes (Signed)
CSN: [id number]     Arrival date & time 10/28/13  0930 History   First MD Initiated Contact with Patient 10/28/13 [id number]   This chart was scribed for Joya Gaskinsonald W Saliyah Gillin, MD by Gwenevere AbbotAlexis Brown, ED scribe. This patient was seen in room APA11/APA11 and the patient's care was started at 9:58 AM.    Chief Complaint  Patient presents with  . Leg Swelling   The history is provided by the patient. No language interpreter was used.  HPI Comments:  Nestor Lewandowskyugene Laperle is a 43 y.o. male who presents to the Emergency Department complaining of constant, swelling in both feet, but predominantly the right foot, with associated symptoms of pain and erythema, onset last night.  Pt also complains of fever since last night.  He also reports chronic groin pain for months.     Pt denies injury to right leg or foot. Pt denies current CP or SOB. Pt has a history of hypertension and gastric bypass, but denies any other health issues.    Past Medical History  Diagnosis Date  . Diabetes mellitus without complication   . Gout   . ADHD (attention deficit hyperactivity disorder)   . Hypertension    Past Surgical History  Procedure Laterality Date  . Gastric bypass     Family History  Problem Relation Age of Onset  . Diabetes Mother    History  Substance Use Topics  . Smoking status: Never Smoker   . Smokeless tobacco: Never Used  . Alcohol Use: No    Review of Systems  Constitutional: Positive for fever.  Respiratory: Positive for cough.   Gastrointestinal: Positive for abdominal pain.  Genitourinary:       Groin Pain  Musculoskeletal: Positive for arthralgias and joint swelling.  Skin: Positive for color change.       Erythema to right foot  All other systems reviewed and are negative.     Allergies  Nsaids  Home Medications   Prior to Admission medications   Medication Sig Start Date End Date Taking? Authorizing Provider  acetaminophen (TYLENOL) 500 MG tablet Take 1,500 mg by mouth every 6 (six) hours  as needed for moderate pain.    Historical Provider, MD  clonazePAM (KLONOPIN) 1 MG tablet Take 0.5-2 mg by mouth. 1/2 tablet at 6 pm and 2 tablets at bedtime.    Historical Provider, MD  colchicine 0.6 MG tablet Take 2 tablets by mouth followed by 1 additional tablet one hour later. Do NOT exceed 3 tablets per gout attack 04/27/13   Charles B. Bernette MayersSheldon, MD  gabapentin (NEURONTIN) 800 MG tablet Take 400-1,600 mg by mouth 2 (two) times daily. 1/2 tablet every morning and 2 tablets every night at bedtime.    Historical Provider, MD  Garlic 1000 MG CAPS Take 1 capsule by mouth daily.    Historical Provider, MD  Iodine, Kelp, (KELP PO) Take 1 capsule by mouth daily as needed (for energy).    Historical Provider, MD  Lactobacillus (ULTIMATE PROBIOTIC FORMULA PO) Take 1 capsule by mouth daily as needed (immune system).    Historical Provider, MD  loratadine (CLARITIN) 10 MG tablet Take 10 mg by mouth daily as needed for allergies.    Historical Provider, MD  Misc Natural Products (COLON HERBAL CLEANSER) CAPS Take 2 capsules by mouth daily.    Historical Provider, MD  omeprazole (PRILOSEC) 40 MG capsule Take 40 mg by mouth daily.    Historical Provider, MD  oxyCODONE-acetaminophen (PERCOCET/ROXICET) 5-325 MG per tablet Take 2  tablets by mouth every 4 (four) hours as needed for severe pain. 04/27/13   Charles B. Bernette MayersSheldon, MD  oxyCODONE-acetaminophen (PERCOCET/ROXICET) 5-325 MG per tablet Take 2 tablets by mouth every 6 (six) hours as needed for severe pain. 06/03/13   Tammy L. Triplett, PA-C  venlafaxine XR (EFFEXOR-XR) 150 MG 24 hr capsule Take 150 mg by mouth daily.    Historical Provider, MD  vitamin E 400 UNIT capsule Take 800 Units by mouth daily.    Historical Provider, MD   BP 188/82  Pulse 93  Temp(Src) 98.2 F (36.8 C) (Oral)  Resp 18  Ht 5\' 11"  (1.803 m)  Wt 348 lb (157.852 kg)  BMI 48.56 kg/m2  SpO2 97% Physical Exam CONSTITUTIONAL: Well developed/well nourished HEAD:  Normocephalic/atraumatic EYES: EOMI/PERRL ENMT: Mucous membranes moist NECK: supple no meningeal signs SPINE:entire spine nontender CV: S1/S2 noted, no murmurs/rubs/gallops noted LUNGS: Lungs are clear to auscultation bilaterally, no apparent distress ABDOMEN: soft, nontender, no rebound or guarding GU:no cva tenderness NEURO: Pt is awake/alert, moves all extremitiesx4 EXTREMITIES: pulses normal, full ROM. No crepitus.  No abscess noted.  He is able to range the right ankle joint, no puncture wounds noted to feet or in between toes.  See photo below SKIN: warm, color normal PSYCH: no abnormalities of mood noted         ED Course  Procedures  10:08 AM Patient informed of clinical course, understand medical decision-making process, and agree with plan.   DVT study negative Will treat for cellulitis We discussed strict return precautions Pt admits to have chronic edema in both legs (pt obese) but worse in right LE  MDM   Final diagnoses:  Cellulitis of right lower extremity    Nursing notes including past medical history and social history reviewed and considered in documentation Labs/vital reviewed and considered  I personally performed the services described in this documentation, which was scribed in my presence. The recorded information has been reviewed and is accurate.        Joya Gaskinsonald W Shilah Hefel, MD 10/28/13 [phone]

## 2013-10-28 NOTE — ED Notes (Addendum)
Feet swelling times 1 1/2 week.  Right foot worse than left.  States he feels like he is running a fever.

## 2013-11-01 DIAGNOSIS — F329 Major depressive disorder, single episode, unspecified: Secondary | ICD-10-CM | POA: Diagnosis not present

## 2013-11-01 DIAGNOSIS — F3289 Other specified depressive episodes: Secondary | ICD-10-CM | POA: Diagnosis not present

## 2013-11-09 DIAGNOSIS — E119 Type 2 diabetes mellitus without complications: Secondary | ICD-10-CM | POA: Diagnosis not present

## 2013-11-09 DIAGNOSIS — L02619 Cutaneous abscess of unspecified foot: Secondary | ICD-10-CM | POA: Diagnosis not present

## 2013-11-09 DIAGNOSIS — N508 Other specified disorders of male genital organs: Secondary | ICD-10-CM | POA: Diagnosis not present

## 2013-11-09 DIAGNOSIS — L03119 Cellulitis of unspecified part of limb: Secondary | ICD-10-CM | POA: Diagnosis not present

## 2013-11-09 DIAGNOSIS — I10 Essential (primary) hypertension: Secondary | ICD-10-CM | POA: Diagnosis not present

## 2013-11-16 DIAGNOSIS — F331 Major depressive disorder, recurrent, moderate: Secondary | ICD-10-CM | POA: Diagnosis not present

## 2013-11-18 DIAGNOSIS — G4730 Sleep apnea, unspecified: Secondary | ICD-10-CM | POA: Diagnosis not present

## 2013-11-18 DIAGNOSIS — E119 Type 2 diabetes mellitus without complications: Secondary | ICD-10-CM | POA: Diagnosis not present

## 2013-11-18 DIAGNOSIS — I10 Essential (primary) hypertension: Secondary | ICD-10-CM | POA: Diagnosis not present

## 2013-11-18 DIAGNOSIS — E7800 Pure hypercholesterolemia, unspecified: Secondary | ICD-10-CM | POA: Diagnosis not present

## 2013-12-13 ENCOUNTER — Ambulatory Visit (HOSPITAL_BASED_OUTPATIENT_CLINIC_OR_DEPARTMENT_OTHER): Payer: Medicare Other

## 2013-12-19 DIAGNOSIS — M109 Gout, unspecified: Secondary | ICD-10-CM | POA: Diagnosis not present

## 2013-12-19 DIAGNOSIS — F3289 Other specified depressive episodes: Secondary | ICD-10-CM | POA: Diagnosis not present

## 2013-12-19 DIAGNOSIS — I10 Essential (primary) hypertension: Secondary | ICD-10-CM | POA: Diagnosis not present

## 2013-12-19 DIAGNOSIS — F329 Major depressive disorder, single episode, unspecified: Secondary | ICD-10-CM | POA: Diagnosis not present

## 2013-12-19 DIAGNOSIS — F411 Generalized anxiety disorder: Secondary | ICD-10-CM | POA: Diagnosis not present

## 2014-01-05 DIAGNOSIS — F411 Generalized anxiety disorder: Secondary | ICD-10-CM | POA: Diagnosis not present

## 2014-01-05 DIAGNOSIS — I10 Essential (primary) hypertension: Secondary | ICD-10-CM | POA: Diagnosis not present

## 2014-01-05 DIAGNOSIS — M109 Gout, unspecified: Secondary | ICD-10-CM | POA: Diagnosis not present

## 2014-01-09 DIAGNOSIS — F331 Major depressive disorder, recurrent, moderate: Secondary | ICD-10-CM | POA: Diagnosis not present

## 2014-01-15 ENCOUNTER — Emergency Department (HOSPITAL_COMMUNITY)
Admission: EM | Admit: 2014-01-15 | Discharge: 2014-01-16 | Disposition: A | Discharge status: Home / Self Care | Payer: Medicare Other | Attending: Emergency Medicine | Admitting: Emergency Medicine

## 2014-01-15 ENCOUNTER — Emergency Department (HOSPITAL_COMMUNITY): Payer: Medicare Other

## 2014-01-15 ENCOUNTER — Encounter (HOSPITAL_COMMUNITY): Payer: Self-pay | Admitting: Emergency Medicine

## 2014-01-15 DIAGNOSIS — J309 Allergic rhinitis, unspecified: Secondary | ICD-10-CM | POA: Diagnosis not present

## 2014-01-15 DIAGNOSIS — M25569 Pain in unspecified knee: Secondary | ICD-10-CM | POA: Insufficient documentation

## 2014-01-15 DIAGNOSIS — F909 Attention-deficit hyperactivity disorder, unspecified type: Secondary | ICD-10-CM | POA: Diagnosis not present

## 2014-01-15 DIAGNOSIS — R51 Headache: Secondary | ICD-10-CM | POA: Diagnosis not present

## 2014-01-15 DIAGNOSIS — M25562 Pain in left knee: Secondary | ICD-10-CM

## 2014-01-15 DIAGNOSIS — E876 Hypokalemia: Secondary | ICD-10-CM | POA: Diagnosis not present

## 2014-01-15 DIAGNOSIS — Z79899 Other long term (current) drug therapy: Secondary | ICD-10-CM | POA: Diagnosis not present

## 2014-01-15 DIAGNOSIS — I10 Essential (primary) hypertension: Secondary | ICD-10-CM | POA: Insufficient documentation

## 2014-01-15 DIAGNOSIS — R519 Headache, unspecified: Secondary | ICD-10-CM

## 2014-01-15 DIAGNOSIS — E119 Type 2 diabetes mellitus without complications: Secondary | ICD-10-CM | POA: Diagnosis not present

## 2014-01-15 MED ORDER — DIPHENHYDRAMINE HCL 50 MG/ML IJ SOLN
25.0000 mg | Freq: Once | INTRAMUSCULAR | Status: AC
  Administered 2014-01-15: 25 mg via INTRAVENOUS
  Filled 2014-01-15: qty 1

## 2014-01-15 MED ORDER — METOCLOPRAMIDE HCL 5 MG/ML IJ SOLN
10.0000 mg | Freq: Once | INTRAMUSCULAR | Status: AC
  Administered 2014-01-15: 10 mg via INTRAVENOUS
  Filled 2014-01-15: qty 2

## 2014-01-15 NOTE — ED Provider Notes (Signed)
CSN: [id number]     Arrival date & time 01/15/14  2301 History   First MD Initiated Contact with Patient 01/15/14 2331   This chart was scribed for Joya Gaskins, MD by Gwenevere Abbot, ED scribe. This patient was seen in room [facility]12/[facility]12 and the patient's care was started at 11:36 PM.    Chief Complaint  Patient presents with  . Headache   Patient is a 43 y.o. male presenting with headaches. The history is provided by the patient. No language interpreter was used.  Headache Pain location:  Generalized Quality:  Dull Radiates to:  Does not radiate Timing:  Constant Progression:  Worsening Chronicity:  New Relieved by:  Nothing Associated symptoms: numbness    HPI Comments:  Julian Richardson is a 43 y.o. male who presents to the Emergency Department complaining of HTN, with associated symptosis of gradually worsening headache, dehydration, and a gout flare up in the left knee, onset 3 days ago. Pt reports that his headache became severe today at 3:00PM. Pt reports that his allergies flared up as well, and that he took allergy medication, which he is concerned may have raised his BP.Pt reports that he is also experiencing a whistling noise in his ears, along with pressure in his head and diffuse numbness of the extremities. Pt reports that he has also experienced involuntary movements, when attempting to go to sleep but this is not new and has been occurring since stopping his klonopin several weeks ago. Pt reports that he stopped taking his BP medication 4 days ago due to side effects. Pt attempted to contact PCP, regarding medication, but was unsuccessful.Pt denies that he gets headaches frequently.Pt reports that this is the first time that he has experienced a headache this severe. Pt denies head injury. Pt denies slurred speech, and denies focal weakness.  Past Medical History  Diagnosis Date  . Diabetes mellitus without complication   . Gout   . ADHD (attention deficit hyperactivity  disorder)   . Hypertension    Past Surgical History  Procedure Laterality Date  . Gastric bypass     Family History  Problem Relation Age of Onset  . Diabetes Mother    History  Substance Use Topics  . Smoking status: Never Smoker   . Smokeless tobacco: Never Used  . Alcohol Use: No    Review of Systems  Musculoskeletal: Positive for joint swelling.  Allergic/Immunologic: Positive for environmental allergies.  Neurological: Positive for numbness and headaches. Negative for weakness.  All other systems reviewed and are negative.   Allergies  Ibuprofen and Nsaids  Home Medications   Prior to Admission medications   Medication Sig Start Date End Date Taking? Authorizing Provider  acetaminophen (TYLENOL) 500 MG tablet Take 1,500 mg by mouth every 6 (six) hours as needed for moderate pain.   Yes Historical Provider, MD  Chlorpheniramine Maleate (ALLERGY PO) Take 1 tablet by mouth daily as needed (allergies).   Yes Historical Provider, MD  venlafaxine XR (EFFEXOR-XR) 150 MG 24 hr capsule Take 150 mg by mouth daily.   Yes Historical Provider, MD  CAFFEINE CITRATE PO Take 1 tablet by mouth daily.    Historical Provider, MD  clindamycin (CLEOCIN) 300 MG capsule Take 1 capsule (300 mg total) by mouth 4 (four) times daily. X 7 days 10/28/13   Joya Gaskins, MD  clonazePAM (KLONOPIN) 1 MG tablet Take 0.5-2 mg by mouth. 1/2 tablet at 6 pm and 2 tablets at bedtime.    Historical Provider, MD  gabapentin (NEURONTIN) 800 MG tablet Take 400-1,600 mg by mouth 2 (two) times daily. 1/2 tablet every morning and 2 tablets every night at bedtime.    Historical Provider, MD  oxyCODONE-acetaminophen (PERCOCET/ROXICET) 5-325 MG per tablet Take 1 tablet by mouth every 4 (four) hours as needed for severe pain. 10/28/13   Joya Gaskins, MD   BP 199/117  Pulse 101  Temp(Src) 98.7 F (37.1 C) (Oral)  Resp 20  Ht  (1.803 m)  Wt 359 lb (162.841 kg)  BMI 50.09 kg/m2  SpO2 99% Physical  Exam CONSTITUTIONAL: Well developed/well nourished HEAD: Normocephalic/atraumatic EYES: EOMI/PERRL, no nystagmus, no ptosis ENMT: Mucous membranes moist NECK: supple no meningeal signs, no bruits SPINE:entire spine nontender CV: S1/S2 noted, no murmurs/rubs/gallops noted LUNGS: Lungs are clear to auscultation bilaterally, no apparent distress ABDOMEN: soft, nontender, no rebound or guarding GU:no cva tenderness NEURO:Awake/alert, facies symmetric, no arm or leg drift is noted Equal 5/5 strength with shoulder abduction, elbow flex/extension, wrist flex/extension in upper extremities and equal hand grips bilaterally Equal 5/5 strength with hip flexion,knee flex/extension, foot dorsi/plantar flexion Cranial nerves 3/4/5/6/11/03/08/11/12 tested and intact Gait normal without ataxia No past pointing EXTREMITIES: pulses normal, full ROM. Tenderness to palpation of left knee. No deformity or erythema noted. No calf tenderness noted SKIN: warm, color normal PSYCH: no abnormalities of mood noted  ED Course  Procedures  DIAGNOSTIC STUDIES: Oxygen Saturation is 99% on RA, normal by my interpretation.  COORDINATION OF CARE: 11:41 PM-Discussed treatment plan which includes ct head/chem 8  with pt at bedside and pt agreed to plan.  Pt with multiple complaints: He reports gradual onset of HA recently, appeared worse since stopping his BP medications.  He has no neurologic deficits (he had reported numbness but appeared to resolve) He is ambulatory without any acute issue.  CT head done due to new onset HA and imaging negative.  I have low suspicion for [facility] (denies sudden onset)  For his knee pain, denies trauma, reports similar to prior gout.  Will start pain medications Advised need to take his BP meds (losartan) and f/u with PCP We discussed strict return precautions Pt appropriate for d/c home  He had reaction to reglan, suspect akathisia (pt became anxious/restless after reglan) He is now  improved Advised not to use reglan in the future  Labs Review Labs Reviewed  I-STAT CHEM 8, ED - Abnormal; Notable for the following:    Potassium 3.2 (*)    Glucose, Bld 115 (*)    All other components within normal limits     MDM   Final diagnoses:  Headache, unspecified headache type  Essential hypertension  Left knee pain  Hypokalemia    Labs/vital reviewed and considered Nursing notes including past medical history and social history reviewed and considered in documentation   I personally performed the services described in this documentation, which was scribed in my presence. The recorded information has been reviewed and is accurate.       Joya Gaskins, MD 01/16/14 [phone]

## 2014-01-15 NOTE — ED Notes (Signed)
Patient stopped taking his Losartin on Thursday, he thought it was making him sick.

## 2014-01-15 NOTE — ED Notes (Signed)
Feel dehydrated, having a headache that started around 6 pm. Gout in my left knee per pt.

## 2014-01-16 DIAGNOSIS — R51 Headache: Secondary | ICD-10-CM | POA: Diagnosis not present

## 2014-01-16 LAB — I-STAT CHEM 8, ED
BUN: 11 mg/dL (ref 6–23)
Calcium, Ion: 1.16 mmol/L (ref 1.12–1.23)
Chloride: 100 mEq/L (ref 96–112)
Creatinine, Ser: 1.3 mg/dL (ref 0.50–1.35)
Glucose, Bld: 115 mg/dL — ABNORMAL HIGH (ref 70–99)
HCT: 50 % (ref 39.0–52.0)
Hemoglobin: 17 g/dL (ref 13.0–17.0)
Potassium: 3.2 mEq/L — ABNORMAL LOW (ref 3.7–5.3)
Sodium: 138 mEq/L (ref 137–147)
TCO2: 30 mmol/L (ref 0–100)

## 2014-01-16 MED ORDER — DIPHENHYDRAMINE HCL 50 MG/ML IJ SOLN
25.0000 mg | Freq: Once | INTRAMUSCULAR | Status: DC
Start: 2014-01-16 — End: 2014-01-16

## 2014-01-16 MED ORDER — OXYCODONE-ACETAMINOPHEN 5-325 MG PO TABS: 1.0000 | ORAL_TABLET | ORAL | Status: DC | PRN

## 2014-01-16 MED ORDER — POTASSIUM CHLORIDE CRYS ER 20 MEQ PO TBCR
40.0000 meq | EXTENDED_RELEASE_TABLET | Freq: Once | ORAL | Status: AC
  Administered 2014-01-16: 40 meq via ORAL
  Filled 2014-01-16: qty 2

## 2014-01-16 MED ORDER — LORAZEPAM 2 MG/ML IJ SOLN
1.0000 mg | Freq: Once | INTRAMUSCULAR | Status: AC
  Administered 2014-01-16: 1 mg via INTRAVENOUS
  Filled 2014-01-16: qty 1

## 2014-01-16 NOTE — Discharge Instructions (Signed)

## 2014-01-20 ENCOUNTER — Encounter (HOSPITAL_COMMUNITY): Payer: Self-pay | Admitting: Emergency Medicine

## 2014-01-20 ENCOUNTER — Emergency Department (HOSPITAL_COMMUNITY): Payer: Medicare Other

## 2014-01-20 ENCOUNTER — Emergency Department (HOSPITAL_COMMUNITY)
Admission: EM | Admit: 2014-01-20 | Discharge: 2014-01-20 | Disposition: A | Discharge status: Home / Self Care | Payer: Medicare Other | Attending: Emergency Medicine | Admitting: Emergency Medicine

## 2014-01-20 DIAGNOSIS — Z9884 Bariatric surgery status: Secondary | ICD-10-CM | POA: Diagnosis not present

## 2014-01-20 DIAGNOSIS — Z8659 Personal history of other mental and behavioral disorders: Secondary | ICD-10-CM | POA: Insufficient documentation

## 2014-01-20 DIAGNOSIS — F329 Major depressive disorder, single episode, unspecified: Secondary | ICD-10-CM | POA: Diagnosis not present

## 2014-01-20 DIAGNOSIS — I119 Hypertensive heart disease without heart failure: Secondary | ICD-10-CM | POA: Diagnosis not present

## 2014-01-20 DIAGNOSIS — E781 Pure hyperglyceridemia: Secondary | ICD-10-CM | POA: Diagnosis not present

## 2014-01-20 DIAGNOSIS — R109 Unspecified abdominal pain: Secondary | ICD-10-CM | POA: Diagnosis not present

## 2014-01-20 DIAGNOSIS — K219 Gastro-esophageal reflux disease without esophagitis: Secondary | ICD-10-CM | POA: Diagnosis not present

## 2014-01-20 DIAGNOSIS — R1013 Epigastric pain: Secondary | ICD-10-CM | POA: Diagnosis not present

## 2014-01-20 DIAGNOSIS — R509 Fever, unspecified: Secondary | ICD-10-CM | POA: Diagnosis not present

## 2014-01-20 DIAGNOSIS — I10 Essential (primary) hypertension: Secondary | ICD-10-CM | POA: Insufficient documentation

## 2014-01-20 DIAGNOSIS — M109 Gout, unspecified: Secondary | ICD-10-CM | POA: Diagnosis not present

## 2014-01-20 DIAGNOSIS — E119 Type 2 diabetes mellitus without complications: Secondary | ICD-10-CM | POA: Insufficient documentation

## 2014-01-20 DIAGNOSIS — K279 Peptic ulcer, site unspecified, unspecified as acute or chronic, without hemorrhage or perforation: Secondary | ICD-10-CM | POA: Diagnosis not present

## 2014-01-20 DIAGNOSIS — Z79899 Other long term (current) drug therapy: Secondary | ICD-10-CM | POA: Insufficient documentation

## 2014-01-20 DIAGNOSIS — F909 Attention-deficit hyperactivity disorder, unspecified type: Secondary | ICD-10-CM | POA: Diagnosis not present

## 2014-01-20 DIAGNOSIS — F3289 Other specified depressive episodes: Secondary | ICD-10-CM | POA: Diagnosis not present

## 2014-01-20 LAB — URINALYSIS, ROUTINE W REFLEX MICROSCOPIC
Bilirubin Urine: NEGATIVE
Glucose, UA: NEGATIVE mg/dL
Ketones, ur: NEGATIVE mg/dL
Leukocytes, UA: NEGATIVE
Nitrite: NEGATIVE
Protein, ur: 100 mg/dL — AB
Specific Gravity, Urine: 1.018 (ref 1.005–1.030)
Urobilinogen, UA: 0.2 mg/dL (ref 0.0–1.0)
pH: 6 (ref 5.0–8.0)

## 2014-01-20 LAB — CBC WITH DIFFERENTIAL/PLATELET
Basophils Absolute: 0 10*3/uL (ref 0.0–0.1)
Basophils Relative: 0 % (ref 0–1)
Eosinophils Absolute: 0.1 10*3/uL (ref 0.0–0.7)
Eosinophils Relative: 1 % (ref 0–5)
HCT: 42.9 % (ref 39.0–52.0)
Hemoglobin: 14.7 g/dL (ref 13.0–17.0)
Lymphocytes Relative: 30 % (ref 12–46)
Lymphs Abs: 1.7 10*3/uL (ref 0.7–4.0)
MCH: 29.3 pg (ref 26.0–34.0)
MCHC: 34.3 g/dL (ref 30.0–36.0)
MCV: 85.5 fL (ref 78.0–100.0)
Monocytes Absolute: 0.5 10*3/uL (ref 0.1–1.0)
Monocytes Relative: 9 % (ref 3–12)
Neutro Abs: 3.4 10*3/uL (ref 1.7–7.7)
Neutrophils Relative %: 60 % (ref 43–77)
Platelets: 297 10*3/uL (ref 150–400)
RBC: 5.02 MIL/uL (ref 4.22–5.81)
RDW: 14.3 % (ref 11.5–15.5)
WBC: 5.7 10*3/uL (ref 4.0–10.5)

## 2014-01-20 LAB — LIPASE, BLOOD: Lipase: 10 U/L — ABNORMAL LOW (ref 11–59)

## 2014-01-20 LAB — URINE MICROSCOPIC-ADD ON

## 2014-01-20 LAB — COMPREHENSIVE METABOLIC PANEL
ALT: 41 U/L (ref 0–53)
AST: 34 U/L (ref 0–37)
Albumin: 3.8 g/dL (ref 3.5–5.2)
Alkaline Phosphatase: 74 U/L (ref 39–117)
Anion gap: 13 (ref 5–15)
BUN: 10 mg/dL (ref 6–23)
CO2: 27 mEq/L (ref 19–32)
Calcium: 9.5 mg/dL (ref 8.4–10.5)
Chloride: 100 mEq/L (ref 96–112)
Creatinine, Ser: 1.04 mg/dL (ref 0.50–1.35)
GFR calc Af Amer: >90 mL/min (ref >90)
GFR calc non Af Amer: 87 mL/min — ABNORMAL LOW (ref >90)
Glucose, Bld: 108 mg/dL — ABNORMAL HIGH (ref 70–99)
Potassium: 3.9 mEq/L (ref 3.7–5.3)
Sodium: 140 mEq/L (ref 137–147)
Total Bilirubin: 0.5 mg/dL (ref 0.3–1.2)
Total Protein: 8.4 g/dL — ABNORMAL HIGH (ref 6.0–8.3)

## 2014-01-20 MED ORDER — RANITIDINE HCL 150 MG PO TABS: 150.0000 mg | ORAL_TABLET | Freq: Two times a day (BID) | ORAL | Status: DC

## 2014-01-20 MED ORDER — ZOLPIDEM TARTRATE 5 MG PO TABS: 5.0000 mg | ORAL_TABLET | Freq: Every evening | ORAL | Status: DC | PRN

## 2014-01-20 MED ORDER — GI COCKTAIL ~~LOC~~
30.0000 mL | Freq: Once | ORAL | Status: AC
  Administered 2014-01-20: 30 mL via ORAL
  Filled 2014-01-20: qty 30

## 2014-01-20 MED ORDER — SUCRALFATE 1 G PO TABS: 1.0000 g | ORAL_TABLET | Freq: Three times a day (TID) | ORAL | Status: DC

## 2014-01-20 MED ORDER — OMEPRAZOLE 20 MG PO CPDR: 20.0000 mg | DELAYED_RELEASE_CAPSULE | Freq: Every day | ORAL | Status: DC

## 2014-01-20 NOTE — ED Notes (Signed)
Pt arrives sent over from PMD office for evaluation of patients abdominal pain. Pt reports burning sensation in left upper abdomen for the last 4 days. Pt states hx of gastric bypass. Denies nasuea, vomiting. LBM yesterday, normal. Pt reports sweats, and chills. Pt awake, alert, oriented x4, moderate distress noted.

## 2014-01-20 NOTE — Discharge Instructions (Signed)
Please call your doctor for a followup appointment within 24-48 hours. When you talk to your doctor please let them know that you were seen in the emergency department and have them acquire all of your records so that they can discuss the findings with you and formulate a treatment plan to fully care for your new and ongoing problems.  after a gastric bypass, you are at risk for stomach ulcers, and he must followup with your family doctor this week for recheck.

## 2014-01-20 NOTE — ED Notes (Addendum)
States that pain started 4 days ago in his abdominal area; pain is worse when lying down (10/10); states that he has taken nexium; went to his PMD, who sent him here because unable to get appt with specialist today. Pt states he has been weak and exhausted x 4 days, which is unusual. Also c/o tremors in rt leg and rt foot x 2-3 months but is occurring more often since the abdominal pain started. Pt states that he started taking losartan for HTN approximately 1 month ago (switched from metroprolol) but has not taken since Sunday due to allergic reaction -  Was seen at [facility] for reaction and was told to f/u with PMD regarding BP med.

## 2014-01-20 NOTE — ED Notes (Signed)
MD at the bedside  

## 2014-01-20 NOTE — ED Notes (Signed)
Pt discharged to home with mother; verbalized understanding of discharge instructions; NAD noted.

## 2014-01-20 NOTE — ED Provider Notes (Signed)
CSN: [id number]     Arrival date & time 01/20/14  1104 History   First MD Initiated Contact with Patient 01/20/14 1118     Chief Complaint  Patient presents with  . Abdominal Pain     (Consider location/radiation/quality/duration/timing/severity/associated sxs/prior Treatment) HPI Comments: 43 year old male status post gastric bypass surgery in 2011 who presents with a complaint of upper abdominal burning pain. This started over the last 4 days, is persistent at nighttime and worsened when he lays down, better when he sits or stands. It is not associated with vomiting or diarrhea or bowel movements with any blood. He is passing gas, his last bowel movement was yesterday, he does not feel distended. He denies fevers chills coughing shortness of breath or chest pain. He is on medications including Carafate and over-the-counter medications for acid reflux such as Nexium. He was sent here from his family doctor's office for further evaluation as he was unable to get in to see the gastroenterologist.  Patient is a 43 y.o. male presenting with abdominal pain. The history is provided by the patient.  Abdominal Pain   Past Medical History  Diagnosis Date  . Diabetes mellitus without complication   . Gout   . ADHD (attention deficit hyperactivity disorder)   . Hypertension    Past Surgical History  Procedure Laterality Date  . Gastric bypass     Family History  Problem Relation Age of Onset  . Diabetes Mother    History  Substance Use Topics  . Smoking status: Never Smoker   . Smokeless tobacco: Never Used  . Alcohol Use: No    Review of Systems  Gastrointestinal: Positive for abdominal pain.  All other systems reviewed and are negative.     Allergies  Ibuprofen; Nsaids; and Reglan  Home Medications   Prior to Admission medications   Medication Sig Start Date End Date Taking? Authorizing Provider  acetaminophen (TYLENOL) 500 MG tablet Take 1,000 mg by mouth every 6 (six)  hours as needed for mild pain.   Yes Historical Provider, MD  esomeprazole (NEXIUM) 40 MG capsule Take 40 mg by mouth daily at 12 noon.   Yes Historical Provider, MD  Ginkgo Biloba (GINKOBA PO) Take 1 tablet by mouth daily.   Yes Historical Provider, MD  MAGNESIUM PO Take 1 tablet by mouth daily.   Yes Historical Provider, MD  venlafaxine XR (EFFEXOR-XR) 150 MG 24 hr capsule Take 150 mg by mouth daily.   Yes Historical Provider, MD  omeprazole (PRILOSEC) 20 MG capsule Take 1 capsule (20 mg total) by mouth daily. 01/20/14   Vida Roller, MD  ranitidine (ZANTAC) 150 MG tablet Take 1 tablet (150 mg total) by mouth 2 (two) times daily. 01/20/14   Vida Roller, MD  sucralfate (CARAFATE) 1 G tablet Take 1 tablet (1 g total) by mouth 4 (four) times daily -  with meals and at bedtime. 01/20/14   Vida Roller, MD   BP 153/68  Pulse 81  Temp(Src) 98.3 F (36.8 C) (Oral)  Resp 21  SpO2 100% Physical Exam  Nursing note and vitals reviewed. Constitutional: He appears well-developed and well-nourished. No distress.  HENT:  Head: Normocephalic and atraumatic.  Mouth/Throat: Oropharynx is clear and moist. No oropharyngeal exudate.  Eyes: Conjunctivae and EOM are normal. Pupils are equal, round, and reactive to light. Right eye exhibits no discharge. Left eye exhibits no discharge. No scleral icterus.  Neck: Normal range of motion. Neck supple. No JVD present. No thyromegaly present.  Cardiovascular: Normal rate, regular rhythm, normal heart sounds and intact distal pulses.  Exam reveals no gallop and no friction rub.   No murmur heard. Pulmonary/Chest: Effort normal and breath sounds normal. No respiratory distress. He has no wheezes. He has no rales.  Abdominal: Soft. Bowel sounds are normal. He exhibits no distension and no mass. There is tenderness (minimal epigastric tenderness with no guarding).  Other than epigastric tenderness no other tenderness, no distention, no tympanitic sounds to  percussion  Musculoskeletal: Normal range of motion. He exhibits no edema and no tenderness.  Lymphadenopathy:    He has no cervical adenopathy.  Neurological: He is alert. Coordination normal.  Skin: Skin is warm and dry. No rash noted. No erythema.  Psychiatric: He has a normal mood and affect. His behavior is normal.    ED Course  Procedures (including critical care time) Labs Review Labs Reviewed  COMPREHENSIVE METABOLIC PANEL - Abnormal; Notable for the following:    Glucose, Bld 108 (*)    Total Protein 8.4 (*)    GFR calc non Af Amer 87 (*)    All other components within normal limits  URINALYSIS, ROUTINE W REFLEX MICROSCOPIC - Abnormal; Notable for the following:    Hgb urine dipstick SMALL (*)    Protein, ur 100 (*)    All other components within normal limits  LIPASE, BLOOD - Abnormal; Notable for the following:    Lipase 10 (*)    All other components within normal limits  URINE MICROSCOPIC-ADD ON - Abnormal; Notable for the following:    Bacteria, UA FEW (*)    Casts HYALINE CASTS (*)    All other components within normal limits  CBC WITH DIFFERENTIAL    Imaging Review Dg Abd Acute W/chest  01/20/2014   CLINICAL DATA:  Abdominal pain.  Fever.  EXAM: ACUTE ABDOMEN SERIES (ABDOMEN 2 VIEW & CHEST 1 VIEW)  COMPARISON:  Chest radiograph, 06/28/2003  FINDINGS: No bowel dilation is seen to suggest obstruction or adynamic ileus. No air-fluid levels. No free air. Dense material projects within the stool within the colon.  No renal or ureteral stones are evident. Soft tissues are unremarkable.  Heart, mediastinum hila are unremarkable.  Lungs are clear.  Mild degenerative changes noted of the thoracolumbar spine.  IMPRESSION: 1. No acute findings.  No obstruction or free air. 2. No acute cardiopulmonary disease.   Electronically Signed   By: Amie Portland M.D.   On: 01/20/2014 12:42      MDM   Final diagnoses:  PUD (peptic ulcer disease)    The patient has normal vital  signs other than some hypertension, abdominal exam suggestive of gastritis or pancreatitis, check conference metabolic panel to investigate biliary system, pancreatic system, acute abdominal series no free air doubtful, bowel obstruction less likely.  Testing normal, pt feels better after GI cocktail - likely PUD, pt counseled, meds rx  Meds given in ED:  Medications  gi cocktail (Maalox,Lidocaine,Donnatal) (30 mLs Oral Given 01/20/14 1211)    New Prescriptions   OMEPRAZOLE (PRILOSEC) 20 MG CAPSULE    Take 1 capsule (20 mg total) by mouth daily.   RANITIDINE (ZANTAC) 150 MG TABLET    Take 1 tablet (150 mg total) by mouth 2 (two) times daily.   SUCRALFATE (CARAFATE) 1 G TABLET    Take 1 tablet (1 g total) by mouth 4 (four) times daily -  with meals and at bedtime.        Vida Roller, MD 01/20/14  1504 

## 2014-01-20 NOTE — ED Notes (Signed)
Pt returned from xray; monitoring resumed. 

## 2014-01-25 ENCOUNTER — Ambulatory Visit (HOSPITAL_BASED_OUTPATIENT_CLINIC_OR_DEPARTMENT_OTHER): Payer: Medicare Other

## 2014-01-30 DIAGNOSIS — F5104 Psychophysiologic insomnia: Secondary | ICD-10-CM | POA: Diagnosis not present

## 2014-01-30 DIAGNOSIS — Z23 Encounter for immunization: Secondary | ICD-10-CM | POA: Diagnosis not present

## 2014-01-30 DIAGNOSIS — K210 Gastro-esophageal reflux disease with esophagitis: Secondary | ICD-10-CM | POA: Diagnosis not present

## 2014-02-08 ENCOUNTER — Ambulatory Visit (HOSPITAL_BASED_OUTPATIENT_CLINIC_OR_DEPARTMENT_OTHER): Payer: Medicare Other | Attending: Family Medicine | Admitting: Radiology

## 2014-02-08 VITALS — Ht 71.0 in | Wt 343.0 lb

## 2014-02-08 DIAGNOSIS — Z6841 Body Mass Index (BMI) 40.0 and over, adult: Secondary | ICD-10-CM | POA: Insufficient documentation

## 2014-02-08 DIAGNOSIS — G4733 Obstructive sleep apnea (adult) (pediatric): Secondary | ICD-10-CM | POA: Insufficient documentation

## 2014-02-11 DIAGNOSIS — G4733 Obstructive sleep apnea (adult) (pediatric): Secondary | ICD-10-CM | POA: Diagnosis not present

## 2014-02-11 NOTE — Sleep Study (Signed)
   NAME: Julian Lewandowskyugene Kalb DATE OF BIRTH:  09-Aug-1970 MEDICAL RECORD NUMBER [id number]  LOCATION: [facility]  PHYSICIAN: YOUNG,CLINTON D  DATE OF STUDY: 02/08/2014  SLEEP STUDY TYPE: Nocturnal Polysomnogram               REFERRING PHYSICIAN: Renaye RakersBland, Veita, MD  INDICATION FOR STUDY: Insomnia with sleep apnea  EPWORTH SLEEPINESS SCORE:   8/24 HEIGHT: 5\' 11"  (180.3 cm)  WEIGHT: 343 lb (155.584 kg)    Body mass index is 47.86 kg/(m^2).  NECK SIZE: 17 in.  MEDICATIONS: Charted for review  SLEEP ARCHITECTURE: Total sleep time 253 minutes with sleep efficiency 70%. Stage I was 27.5%, stage II 66.2%, stage III absent, REM 6.3% of total sleep time. Sleep latency 43.5 minutes, REM latency 215 minutes, awake after sleep onset 65 minutes, arousal index 45.3. Bedtime medication: Tylenol, sucralfate, zolpidem  RESPIRATORY DATA: Apnea hypopneas index (AHI) 57.2 per hour. 241 total events scored including 173 obstructive apneas, 2 mixed apneas, 66 hypopneas. Non-positional events. REM AHI 90 per hour. Sleep onset was delayed and he did not have enough sleep during the first hours to meet protocol requirements for split CPAP titration.   OXYGEN DATA: Moderate to sometimes very loud snoring with oxygen desaturation to a nadir of 74% and mean saturation 92.5% on room air.  CARDIAC DATA: Sinus rhythm with PVCs  MOVEMENT/PARASOMNIA: No significant movement disturbance, bathroom x1  IMPRESSION/ RECOMMENDATION:   1) Severe obstructive sleep apnea/hypoxia syndrome, AHI 57.2 per hour with non-positional events. REM AHI 90 per hour. Moderate to sometimes very loud snoring with oxygen desaturation to a nadir of 74% and mean saturation 92.5% on room air. 2) The patient did not achieve enough sleep in the first hours of the study to meet protocol requirements for split CPAP titration. He can return for a dedicated CPAP titration study if appropriate.   [name],CLINTON D Diplomate, American Board  of Sleep Medicine  ELECTRONICALLY SIGNED ON:  02/11/2014, 11:37 AM [facility] PH: [phone]   FX: [phone] ACCREDITED BY THE AMERICAN ACADEMY OF SLEEP MEDICINE

## 2014-02-14 DIAGNOSIS — F331 Major depressive disorder, recurrent, moderate: Secondary | ICD-10-CM | POA: Diagnosis not present

## 2014-04-06 DIAGNOSIS — F5104 Psychophysiologic insomnia: Secondary | ICD-10-CM | POA: Diagnosis not present

## 2014-05-22 DIAGNOSIS — F331 Major depressive disorder, recurrent, moderate: Secondary | ICD-10-CM | POA: Diagnosis not present

## 2014-06-06 ENCOUNTER — Encounter (HOSPITAL_BASED_OUTPATIENT_CLINIC_OR_DEPARTMENT_OTHER): Payer: Medicare Other

## 2014-08-04 ENCOUNTER — Encounter (HOSPITAL_COMMUNITY): Payer: Self-pay | Admitting: Emergency Medicine

## 2014-08-04 ENCOUNTER — Emergency Department (HOSPITAL_COMMUNITY)
Admission: EM | Admit: 2014-08-04 | Discharge: 2014-08-04 | Disposition: A | Discharge status: Home / Self Care | Payer: Medicare PPO | Attending: Emergency Medicine | Admitting: Emergency Medicine

## 2014-08-04 ENCOUNTER — Emergency Department (HOSPITAL_COMMUNITY): Payer: Medicare PPO

## 2014-08-04 DIAGNOSIS — E119 Type 2 diabetes mellitus without complications: Secondary | ICD-10-CM | POA: Insufficient documentation

## 2014-08-04 DIAGNOSIS — Z79899 Other long term (current) drug therapy: Secondary | ICD-10-CM | POA: Insufficient documentation

## 2014-08-04 DIAGNOSIS — M109 Gout, unspecified: Secondary | ICD-10-CM | POA: Diagnosis not present

## 2014-08-04 DIAGNOSIS — Z8659 Personal history of other mental and behavioral disorders: Secondary | ICD-10-CM | POA: Diagnosis not present

## 2014-08-04 DIAGNOSIS — M7989 Other specified soft tissue disorders: Secondary | ICD-10-CM | POA: Diagnosis not present

## 2014-08-04 DIAGNOSIS — I10 Essential (primary) hypertension: Secondary | ICD-10-CM | POA: Diagnosis not present

## 2014-08-04 DIAGNOSIS — R1013 Epigastric pain: Secondary | ICD-10-CM | POA: Insufficient documentation

## 2014-08-04 DIAGNOSIS — R55 Syncope and collapse: Secondary | ICD-10-CM

## 2014-08-04 LAB — URINALYSIS, ROUTINE W REFLEX MICROSCOPIC
Bilirubin Urine: NEGATIVE
Glucose, UA: NEGATIVE mg/dL
Hgb urine dipstick: NEGATIVE
Ketones, ur: NEGATIVE mg/dL
Leukocytes, UA: NEGATIVE
Nitrite: NEGATIVE
Protein, ur: 100 mg/dL — AB
Specific Gravity, Urine: 1.017 (ref 1.005–1.030)
Urobilinogen, UA: 1 mg/dL (ref 0.0–1.0)
pH: 7 (ref 5.0–8.0)

## 2014-08-04 LAB — CBC WITH DIFFERENTIAL/PLATELET
Basophils Absolute: 0 10*3/uL (ref 0.0–0.1)
Basophils Relative: 1 % (ref 0–1)
Eosinophils Absolute: 0.3 10*3/uL (ref 0.0–0.7)
Eosinophils Relative: 5 % (ref 0–5)
HCT: 39.7 % (ref 39.0–52.0)
Hemoglobin: 13.2 g/dL (ref 13.0–17.0)
Lymphocytes Relative: 29 % (ref 12–46)
Lymphs Abs: 1.7 10*3/uL (ref 0.7–4.0)
MCH: 29.6 pg (ref 26.0–34.0)
MCHC: 33.2 g/dL (ref 30.0–36.0)
MCV: 89 fL (ref 78.0–100.0)
Monocytes Absolute: 0.6 10*3/uL (ref 0.1–1.0)
Monocytes Relative: 10 % (ref 3–12)
Neutro Abs: 3.3 10*3/uL (ref 1.7–7.7)
Neutrophils Relative %: 55 % (ref 43–77)
Platelets: 213 10*3/uL (ref 150–400)
RBC: 4.46 MIL/uL (ref 4.22–5.81)
RDW: 14.4 % (ref 11.5–15.5)
WBC: 5.9 10*3/uL (ref 4.0–10.5)

## 2014-08-04 LAB — BASIC METABOLIC PANEL
Anion gap: 5 (ref 5–15)
BUN: 7 mg/dL (ref 6–23)
CO2: 28 mmol/L (ref 19–32)
Calcium: 8.8 mg/dL (ref 8.4–10.5)
Chloride: 108 mmol/L (ref 96–112)
Creatinine, Ser: 1.12 mg/dL (ref 0.50–1.35)
GFR calc Af Amer: >90 mL/min (ref >90)
GFR calc non Af Amer: 79 mL/min — ABNORMAL LOW (ref >90)
Glucose, Bld: 143 mg/dL — ABNORMAL HIGH (ref 70–99)
Potassium: 3.7 mmol/L (ref 3.5–5.1)
Sodium: 141 mmol/L (ref 135–145)

## 2014-08-04 LAB — BRAIN NATRIURETIC PEPTIDE: B Natriuretic Peptide: 41 pg/mL (ref 0.0–100.0)

## 2014-08-04 LAB — CBG MONITORING, ED: Glucose-Capillary: 128 mg/dL — ABNORMAL HIGH (ref 70–99)

## 2014-08-04 LAB — URINE MICROSCOPIC-ADD ON

## 2014-08-04 LAB — I-STAT TROPONIN, ED: Troponin i, poc: 0.01 ng/mL (ref 0.00–0.08)

## 2014-08-04 NOTE — Discharge Instructions (Signed)
Near-Syncope °Near-syncope (commonly known as near fainting) is sudden weakness, dizziness, or feeling like you might pass out. During an episode of near-syncope, you may also develop pale skin, have tunnel vision, or feel sick to your stomach (nauseous). Near-syncope may occur when getting up after sitting or while standing for a long time. It is caused by a sudden decrease in blood flow to the brain. This decrease can result from various causes or triggers, most of which are not serious. However, because near-syncope can sometimes be a sign of something serious, a medical evaluation is required. The specific cause is often not determined. °HOME CARE INSTRUCTIONS  °Monitor your condition for any changes. The following actions may help to alleviate any discomfort you are experiencing: °· Have someone stay with you until you feel stable. °· Lie down right away and prop your feet up if you start feeling like you might faint. Breathe deeply and steadily. Wait until all the symptoms have passed. Most of these episodes last only a few minutes. You may feel tired for several hours.   °· Drink enough fluids to keep your urine clear or pale yellow.   °· If you are taking blood pressure or heart medicine, get up slowly when seated or lying down. Take several minutes to sit and then stand. This can reduce dizziness. °· Follow up with your health care provider as directed.  °SEEK IMMEDIATE MEDICAL CARE IF:  °· You have a severe headache.   °· You have unusual pain in the chest, abdomen, or back.   °· You are bleeding from the mouth or rectum, or you have black or tarry stool.   °· You have an irregular or very fast heartbeat.   °· You have repeated fainting or have seizure-like jerking during an episode.   °· You faint when sitting or lying down.   °· You have confusion.   °· You have difficulty walking.   °· You have severe weakness.   °· You have vision problems.   °MAKE SURE YOU:  °· Understand these instructions. °· Will  watch your condition. °· Will get help right away if you are not doing well or get worse. °Document Released: 04/14/2005 Document Revised: 04/19/2013 Document Reviewed: 09/17/2012 °ExitCare® Patient Information ©2015 [employer]. This information is not intended to replace advice given to you by your health care provider. Make sure you discuss any questions you have with your health care provider. ° ° °Emergency Department Resource Guide °1) Find a Doctor and Pay Out of Pocket °Although you won't have to find out who is covered by your insurance plan, it is a good idea to ask around and get recommendations. You will then need to call the office and see if the doctor you have chosen will accept you as a new patient and what types of options they offer for patients who are self-pay. Some doctors offer discounts or will set up payment plans for their patients who do not have insurance, but you will need to ask so you aren't surprised when you get to your appointment. ° °2) Contact Your Local Health Department °Not all health departments have doctors that can see patients for sick visits, but many do, so it is worth a call to see if yours does. If you don't know where your local health department is, you can check in your phone book. The CDC also has a tool to help you locate your state's health department, and many state websites also have listings of all of their local health departments. ° °3) Find a Walk-in Clinic °  If your illness is not likely to be very severe or complicated, you may want to try a walk in clinic. These are popping up all over the country in pharmacies, drugstores, and shopping centers. They're usually staffed by nurse practitioners or physician assistants that have been trained to treat common illnesses and complaints. They're usually fairly quick and inexpensive. However, if you have serious medical issues or chronic medical problems, these are probably not your best option. ° °No Primary Care  Doctor: °- Call [facility] at  832-8000 - they can help you locate a primary care doctor that  accepts your insurance, provides certain services, etc. °- Physician Referral Service- 1-800-533-3463 ° °Chronic Pain Problems: °Organization         Address  Phone   Notes  °[facility]  (336) 297-2271 Patients need to be referred by their primary care doctor.  ° °Medication Assistance: °Organization         Address  Phone   Notes  °[place] [facility] [address] °[place], [place] [address] (336) 641-8030 --Must be a resident of [place] °-- Must have NO insurance coverage whatsoever (no Medicaid/ Medicare, etc.) °-- The pt. MUST have a primary care doctor that directs their care regularly and follows them in the community °  °[facility]  (866) 331-1348   °[employer]  (888) 892-1162   ° °Agencies that provide inexpensive medical care: °Organization         Address  Phone   Notes  °[facility]  (336) 832-8035   °[facility]    (336) 832-7272   °[facility] [address] °[place], [place] [address] (336) 832-4777   °[facility] [address], °[place] (336) 271-4999   °[facility]    (336) 373-0678   °[facility]    (336) 272-1050   °[facility] ° [address], [place] Phone:  (336) 832-4444, Fax:  (336) 832-4440 Hours of Operation:  9 am - 6 pm, M-F.  Also accepts Medicaid/Medicare and self-pay.  °[facility] ° [address], [place] Phone: (336) 832-3150, Fax: (336) 832-3151. Hours of Operation:  8:30 am - 5:30 pm, M-F.  Also accepts Medicaid and self-pay.  °[facility] [place] [address], [place] Phone: (336) 878-6027   °[facility] [address], [place], [place] (336)723-1848, Ext. 123 Mondays & Thursdays: 7-9 AM.  First 15 patients are seen on a first  come, first serve basis. °  ° °Medicaid-accepting [place] Providers: ° °Organization         Address  Phone   Notes  °[facility] [address], [place] (336) 641-2100 Also accepts self-pay patients.  °[facility] [address], [place] ° (336) 856-9996   °[facility] [address], [place] (336) 288-8857   °[facility] [address] [place] [address], [place] (336) 299-7000   °Veita Bland [address], [place]  ° (336) 373-1557 Only accepts [place] Access Medicaid patients after they have their name applied to their card.  ° °Self-Pay (no insurance) in [place]: ° °Organization         Address  Phone   Notes  °Sickle Cell Patients, [facility] [address], [place] (336)   832-1970   °[facility] [address], [place] (336) 832-4400   °[facility] ° [address], [place] (336) 992-4800   °[facility]/Dr. Osei-Bonsu ° [address] [place] [address], [place] or [address], [place] (336) 841-8500 Phone number for both [place] and [place] locations is the same.  °[facility] [address], [place] (336) 299-0000   °[facility] [address] [place] [address], [place] or [address] (336) 852-7530 °(336) 878-2260   °[facility] [address], [place] (336) 350-1642, phone; (336) 294-5005, fax Sees patients 1st and 3rd Saturday of every month.  Must not qualify for public or private insurance (i.e. Medicaid, Medicare, [place] Health Choice, Veterans' Benefits) • Household income should be no more than 200% of the poverty level •The clinic cannot treat you if you are pregnant or think you are pregnant • Sexually transmitted diseases are not treated at the clinic.  ° ° °Dental Care: °Organization          Address  Phone  Notes  °[facility] [address], [place] (336) 641-6152 Accepts children up to age 21 who are enrolled in Medicaid or [place] Health Choice; pregnant women with a Medicaid card; and children who have applied for Medicaid or [place] Health Choice, but were declined, whose parents can pay a reduced fee at time of service.  °[facility] [place]  [address], [place] (336) 641-7733 Accepts children up to age 21 who are enrolled in Medicaid or [place] Health Choice; pregnant women with a Medicaid card; and children who have applied for Medicaid or [place] Health Choice, but were declined, whose parents can pay a reduced fee at time of service.  °[facility] ° [address], [place] (336) 641-4533 Patients are seen by appointment only. Walk-ins are not accepted. [facility] will see patients 18 years of age and older. °Monday - Tuesday (8am-5pm) °Most Wednesdays (8:30-5pm) °$30 per visit, cash only  °[facility] ° [address], [place] (336) 641-4533 Patients are seen by appointment only. Walk-ins are not accepted. [facility] will see patients 18 years of age and older. °One Wednesday Evening (Monthly: Volunteer Based).  $30 per visit, cash only  °[facility]  (919) 537-3737 for adults; Children under age 4, call Graduate Pediatric Dentistry at (919) [phone]. Children aged 4-14, please call (919) [phone] to request a pediatric application. ° Dental services are provided in all areas of dental care including fillings, crowns and bridges, complete and partial dentures, implants, gum treatment, root canals, and extractions. Preventive care is also provided. Treatment is provided to both adults and children. °Patients are selected via a lottery and there is often a waiting list. °  °[facility] [address], °[place] ° (336) 763-8833 [email or web address] °  °[facility] [address], [place], [place] (336)723-1848, Ext. 123 Second and Fourth Thursday of each month, opens at 6:30 AM; Clinic ends at 9 AM.  Patients are seen on a first-come first-served basis, and a limited number are seen during each clinic.  ° °[facility] ° [address], [place], [place] (336) 723-7904   Eligibility Requirements °You must have lived in [place], [place], or [place] counties for   at least the last three months. °  You cannot be eligible for state or federal sponsored healthcare insurance, including Veterans Administration, Medicaid, or Medicare. °  You generally cannot be eligible for healthcare insurance through your employer.  °  How to apply: °Eligibility screenings are held every Tuesday and Wednesday afternoon from 1:00 pm until 4:00 pm. You do not need an appointment for the interview!  °[facility] [address], [place], [place] 336-631-2330   °[facility]  336-342-8273   °[facility]  336-703-3100   °[facility]  336-570-6415   ° °Behavioral Health Resources in the Community: °Intensive Outpatient Programs °Organization         Address  Phone  Notes  °[facility] [address], [place], [place] 336-878-6098   °[facility] [address], [place], [place] 336-832-9800   °[facility] [address], [place], [place] ° 336-882-2125   °[facility] [address],  °[place], [place] 1-800-853-5163 or 336-641-4981   °Substance Abuse Resources °Organization         Address  Phone  Notes  °[facility]  336-882-2125   °[facility]  336-784-9470   °[facility]  336-285-9073   °[facility]  336-845-3988   °Residential & Outpatient Substance Abuse Program  1-800-659-3381   °Psychological  Services °Organization         Address  Phone  Notes  °[facility]  336- 832-9600   °[facility]  336- 378-7881   °[facility] [address], [place] 1-800-853-5163 or 336-641-4981   ° °Mobile Crisis Teams °Organization         Address  Phone  Notes  °[facility], Mobile Crisis Care Unit  1-877-626-1772   °Assertive °[facility] ° [address] [place], [place] 336-834-9664   °Sharon DeEsch [address] °[place] 336-554-5454   ° °Self-Help/Support Groups °Organization         Address  Phone             Notes  °[facility] - variety of support groups  336- 373-1402 Call for more information  °Narcotics Anonymous (NA), [facility] [address], °[place]  2 meetings at this location  ° °Residential Treatment Programs °Organization         Address  Phone  Notes  °[facility] [address],    °[place]  1-866-801-8205   °[facility] ° [address], [place], [place] 704-293-8524   °[facility] [address], [place] 336-845-3988 Admissions: 8am-3pm M-F  °[facility] [address]    °[place], [place] 336-841-1104   °[facility] [address], [place], [place] 336-379-7146   °[facility] [address]  °[place], [place] 336-285-9073   °[facility] Intensive Outpatient [address], [place], [place] 336-852-3033   °[facility] [address]  °[place], [place] 1-877-615-2722 or 336-784-9470   °[facility] [address] [place], [place] 336-227-7417 Accepts Medicaid  °[facility] [address]  °[place]  1-800-659-3381 Substance Abuse/Addiction Treatment  ° °[place] Behavioral Health Resources °Organization         Address  Phone  Notes  °[facility]  (888)    581-9988   °Julie Brannon, PhD [address] [place], [place]   (336) 349-5553 or (336) 951-0000   °[facility]   [address] °[place], [place] (336) 349-4454   °[facility] [address], [place], [place] (336) 342-8316 Insurance/Medicaid/sponsorship through [facility]  °[facility] [address]                                    [place], [place] (336) 342-8316 Therapy/tele-psych/case  °[facility] [address]  ° [place], [place] (336) 349-2233    °Dr. Arfeen  (336) 349-4544   °[facility] [place]  [employer] [place] [facility] 1) [address], [place] °2) [address], [place] °3)  [address], [place] (336) 349-3220 °(336) 342-7768 ° °(336) 342-8140   °[place] [facility] (336) 342-1394 or (336) 342-3537 (After Hours)    ° ° ° °

## 2014-08-04 NOTE — ED Notes (Signed)
Pt was visiting father in ICU, MD told pt that his father was not going to make it and he started to feel like he was going to pass out. Nurse on unit noted that his right leg was more swollen then left and was told to come to ED for eval.

## 2014-08-04 NOTE — Progress Notes (Signed)
*  Preliminary Results* Right lower extremity venous duplex completed. There is no obvious evidence of deep vein thrombosis involving the right lower extremity, however most veins were not visualized with the exception of the right popliteal vein which was patent.  08/04/2014 1:44 PM  Gertie FeyMichelle Kashay Cavenaugh, RVT, RDCS, RDMS

## 2014-08-04 NOTE — ED Provider Notes (Signed)
CSN: [id number]     Arrival date & time 08/04/14  1002 History   First MD Initiated Contact with Patient 08/04/14 1011     Chief Complaint  Patient presents with  . Near Syncope     (Consider location/radiation/quality/duration/timing/severity/associated sxs/prior Treatment) The history is provided by the patient. No language interpreter was used.  Julian Richardson is a 44 y.o male with a history of hypertension who presents for a near syncopal episode while upstairs just prior to arrival in the ED.  He was visiting his father in the ICU today and told that his father likely would not make it.  At that time, he felt weak and off balance and had to sit down.  He has had urinary frequency, epigastric pain, and increased leg swelling for the past two days.  He took an omeprazole which helped relief the abdominal pain.  Nothing makes it worse.   He denies any fever, loss of consiousness, headache, chest pain, shortness of breath, nausea, vomiting, diarrhea, hematuria, or dysuria.  Past Medical History  Diagnosis Date  . Diabetes mellitus without complication   . Gout   . ADHD (attention deficit hyperactivity disorder)   . Hypertension    Past Surgical History  Procedure Laterality Date  . Gastric bypass     Family History  Problem Relation Age of Onset  . Diabetes Mother    History  Substance Use Topics  . Smoking status: Never Smoker   . Smokeless tobacco: Never Used  . Alcohol Use: No    Review of Systems  Gastrointestinal: Negative for blood in stool and abdominal distention.  All other systems reviewed and are negative.     Allergies  Ibuprofen; Nsaids; and Reglan  Home Medications   Prior to Admission medications   Medication Sig Start Date End Date Taking? Authorizing Provider  acetaminophen (TYLENOL) 500 MG tablet Take 1,000 mg by mouth every 6 (six) hours as needed for mild pain.   Yes Historical Provider, MD  allopurinol (ZYLOPRIM) 100 MG tablet Take 100 mg by mouth  daily.   Yes Historical Provider, MD  amLODipine (NORVASC) 5 MG tablet Take 5 mg by mouth daily.   Yes Historical Provider, MD  buPROPion (WELLBUTRIN SR) 150 MG 12 hr tablet Take 150 mg by mouth 2 (two) times daily.   Yes Historical Provider, MD  clonazePAM (KLONOPIN) 0.5 MG tablet Take 0.5 mg by mouth 2 (two) times daily as needed for anxiety.   Yes Historical Provider, MD  cloNIDine (CATAPRES) 0.2 MG tablet Take 0.4 mg by mouth 3 (three) times daily.   Yes Historical Provider, MD  escitalopram (LEXAPRO) 20 MG tablet Take 20 mg by mouth daily.   Yes Historical Provider, MD  lisinopril (PRINIVIL,ZESTRIL) 40 MG tablet Take 40 mg by mouth 2 (two) times daily.   Yes Historical Provider, MD  loratadine (CLARITIN) 10 MG tablet Take 10 mg by mouth daily.   Yes Historical Provider, MD  omeprazole (PRILOSEC) 20 MG capsule Take 1 capsule (20 mg total) by mouth daily. 01/20/14  Yes Eber Hong, MD  sucralfate (CARAFATE) 1 G tablet Take 1 tablet (1 g total) by mouth 4 (four) times daily -  with meals and at bedtime. 01/20/14  Yes Eber Hong, MD  ranitidine (ZANTAC) 150 MG tablet Take 1 tablet (150 mg total) by mouth 2 (two) times daily. Patient not taking: Reported on 08/04/2014 01/20/14   Eber Hong, MD  zolpidem (AMBIEN) 5 MG tablet Take 1 tablet (5 mg total) by  mouth at bedtime as needed for sleep. Patient not taking: Reported on 08/04/2014 01/20/14   Eber HongBrian Miller, MD   BP 156/75 mmHg  Pulse 80  Temp(Src) 98.1 F (36.7 C) (Oral)  Resp 22  Ht 5\' 11"  (1.803 m)  Wt 345 lb (156.491 kg)  BMI 48.14 kg/m2  SpO2 100% Physical Exam  Constitutional: He is oriented to person, place, and time. He appears well-developed and well-nourished.  HENT:  Head: Normocephalic and atraumatic.  Eyes: Conjunctivae and EOM are normal.  Neck: Normal range of motion. Neck supple.  Cardiovascular: Normal rate, regular rhythm and normal heart sounds.   Pulmonary/Chest: Effort normal and breath sounds normal.  Abdominal:  Soft.  Mild reproducible epigastric tenderness. Obese.  Musculoskeletal: Normal range of motion.  Right lower extremity significantly larger than left lower extremity.   Neurological: He is alert and oriented to person, place, and time.  Cranial nerves III-XII in tact.  5/5 strength in upper and lower extremities.   Skin: Skin is warm and dry.  Nursing note and vitals reviewed.   ED Course  Procedures (including critical care time) Labs Review Labs Reviewed  BASIC METABOLIC PANEL - Abnormal; Notable for the following:    Glucose, Bld 143 (*)    GFR calc non Af Amer 79 (*)    All other components within normal limits  URINALYSIS, ROUTINE W REFLEX MICROSCOPIC - Abnormal; Notable for the following:    Protein, ur 100 (*)    All other components within normal limits  CBG MONITORING, ED - Abnormal; Notable for the following:    Glucose-Capillary 128 (*)    All other components within normal limits  CBC WITH DIFFERENTIAL/PLATELET  BRAIN NATRIURETIC PEPTIDE  URINE MICROSCOPIC-ADD ON  Rosezena SensorI-STAT TROPOININ, ED    Imaging Review Dg Chest 2 View  08/04/2014   CLINICAL DATA:  Near-syncope today.  EXAM: CHEST  2 VIEW  COMPARISON:  Single view of the chest 01/20/2014. PA and lateral chest 06/28/2003.  FINDINGS: The lungs are clear. Heart size is upper normal. No pneumothorax or pleural effusion.  IMPRESSION: No acute disease.   Electronically Signed   By: Drusilla Kannerhomas  Dalessio M.D.   On: 08/04/2014 11:08     EKG Interpretation   Date/Time:  Friday August 04 2014 10:07:18 EDT Ventricular Rate:  85 PR Interval:  164 QRS Duration: 92 QT Interval:  384 QTC Calculation: 456 R Axis:   26 Text Interpretation:  Normal sinus rhythm ST \\T \ T wave abnormality,  consider lateral ischemia No previous tracing Confirmed by Anitra LauthPLUNKETT  MD,  Alphonzo LemmingsWHITNEY ([id number]) on 08/04/2014 10:10:14 AM      MDM   Final diagnoses:  Near syncope   Patient has a history of hypertension who presents for a near syncopal episode  while upstairs just prior to arrival in the ED.  He was visiting his father in the ICU today and told that his father likely would not make it.  At that time, he felt weak and off balance and had to sit down.  He has had urinary frequency, epigastric pain, and increased leg swelling for the past two days.  He took an omeprazole which helped relief the abdominal pain.  He states the right leg swelling has been ongoing for the past 4 months and feels like a heaviness. He denies any chest pain, shortness of breath, nausea, or vomiting. His right calf is significantly larger than the left calf. No calf pain.  Negative homans sign.  13:20 Doppler of lower right extremity  is negative for DVT in popliteal vein but otherwise difficult to examine by [place] tech.  The patient is low risk for major cardiac event as determined by physical exam and the HEART score. I think this was likely related to the recent new about his father since it occurred immediately after. I explained the results and that he would need follow up with his pcp.  He agrees with the plan.      Catha Gosselin, PA-C 08/04/14 1322  Gwyneth Sprout, MD 08/08/14 1621

## 2014-08-14 ENCOUNTER — Ambulatory Visit (HOSPITAL_BASED_OUTPATIENT_CLINIC_OR_DEPARTMENT_OTHER): Payer: Medicare PPO | Attending: Family Medicine

## 2014-08-16 ENCOUNTER — Encounter (HOSPITAL_COMMUNITY): Payer: Self-pay | Admitting: *Deleted

## 2014-08-16 ENCOUNTER — Emergency Department (HOSPITAL_COMMUNITY)
Admission: EM | Admit: 2014-08-16 | Discharge: 2014-08-16 | Disposition: A | Discharge status: Home / Self Care | Payer: Medicare HMO | Attending: Emergency Medicine | Admitting: Emergency Medicine

## 2014-08-16 ENCOUNTER — Emergency Department (HOSPITAL_COMMUNITY): Payer: Medicare HMO

## 2014-08-16 DIAGNOSIS — R079 Chest pain, unspecified: Secondary | ICD-10-CM | POA: Insufficient documentation

## 2014-08-16 DIAGNOSIS — Z79899 Other long term (current) drug therapy: Secondary | ICD-10-CM | POA: Insufficient documentation

## 2014-08-16 DIAGNOSIS — F909 Attention-deficit hyperactivity disorder, unspecified type: Secondary | ICD-10-CM | POA: Insufficient documentation

## 2014-08-16 DIAGNOSIS — R1013 Epigastric pain: Secondary | ICD-10-CM | POA: Diagnosis not present

## 2014-08-16 DIAGNOSIS — R109 Unspecified abdominal pain: Secondary | ICD-10-CM | POA: Diagnosis present

## 2014-08-16 DIAGNOSIS — I10 Essential (primary) hypertension: Secondary | ICD-10-CM | POA: Diagnosis not present

## 2014-08-16 DIAGNOSIS — M109 Gout, unspecified: Secondary | ICD-10-CM | POA: Insufficient documentation

## 2014-08-16 DIAGNOSIS — E119 Type 2 diabetes mellitus without complications: Secondary | ICD-10-CM | POA: Diagnosis not present

## 2014-08-16 LAB — CBC WITH DIFFERENTIAL/PLATELET
Basophils Absolute: 0 10*3/uL (ref 0.0–0.1)
Basophils Relative: 0 % (ref 0–1)
Eosinophils Absolute: 0.1 10*3/uL (ref 0.0–0.7)
Eosinophils Relative: 2 % (ref 0–5)
HCT: 42.6 % (ref 39.0–52.0)
Hemoglobin: 14.4 g/dL (ref 13.0–17.0)
Lymphocytes Relative: 25 % (ref 12–46)
Lymphs Abs: 1.5 10*3/uL (ref 0.7–4.0)
MCH: 29.7 pg (ref 26.0–34.0)
MCHC: 33.8 g/dL (ref 30.0–36.0)
MCV: 87.8 fL (ref 78.0–100.0)
Monocytes Absolute: 0.4 10*3/uL (ref 0.1–1.0)
Monocytes Relative: 7 % (ref 3–12)
Neutro Abs: 4.2 10*3/uL (ref 1.7–7.7)
Neutrophils Relative %: 66 % (ref 43–77)
Platelets: 273 10*3/uL (ref 150–400)
RBC: 4.85 MIL/uL (ref 4.22–5.81)
RDW: 13.7 % (ref 11.5–15.5)
WBC: 6.2 10*3/uL (ref 4.0–10.5)

## 2014-08-16 LAB — COMPREHENSIVE METABOLIC PANEL
ALT: 28 U/L (ref 0–53)
AST: 26 U/L (ref 0–37)
Albumin: 3.9 g/dL (ref 3.5–5.2)
Alkaline Phosphatase: 60 U/L (ref 39–117)
Anion gap: 10 (ref 5–15)
BUN: 9 mg/dL (ref 6–23)
CO2: 27 mmol/L (ref 19–32)
Calcium: 9.2 mg/dL (ref 8.4–10.5)
Chloride: 101 mmol/L (ref 96–112)
Creatinine, Ser: 0.99 mg/dL (ref 0.50–1.35)
GFR calc Af Amer: >90 mL/min (ref >90)
GFR calc non Af Amer: >90 mL/min (ref >90)
Glucose, Bld: 134 mg/dL — ABNORMAL HIGH (ref 70–99)
Potassium: 3.4 mmol/L — ABNORMAL LOW (ref 3.5–5.1)
Sodium: 138 mmol/L (ref 135–145)
Total Bilirubin: 0.7 mg/dL (ref 0.3–1.2)
Total Protein: 7.7 g/dL (ref 6.0–8.3)

## 2014-08-16 LAB — LIPASE, BLOOD: Lipase: 14 U/L (ref 11–59)

## 2014-08-16 LAB — TROPONIN I: Troponin I: <0.03 ng/mL (ref <0.031)

## 2014-08-16 MED ORDER — OMEPRAZOLE 40 MG PO CPDR: 40.0000 mg | DELAYED_RELEASE_CAPSULE | Freq: Every day | ORAL | Status: DC

## 2014-08-16 NOTE — ED Notes (Signed)
Pt states has had abdominal pain about 2 weeks, gradually getting worse over last 2 days with tightness that radiates into left chest with burning sensation.

## 2014-08-16 NOTE — ED Notes (Signed)
Pt alert & oriented x4, stable gait. Patient given discharge instructions, paperwork & prescription(s). Patient  instructed to stop at the registration desk to finish any additional paperwork. Patient verbalized understanding. Pt left department w/ no further questions. 

## 2014-08-16 NOTE — Discharge Instructions (Signed)

## 2014-08-16 NOTE — ED Provider Notes (Signed)
CSN: [id number]     Arrival date & time 08/16/14  4098 History   First MD Initiated Contact with Patient 08/16/14 [phone]     Chief Complaint  Patient presents with  . Abdominal Pain     (Consider location/radiation/quality/duration/timing/severity/associated sxs/prior Treatment) Patient is a 44 y.o. male presenting with abdominal pain. The history is provided by the patient.  Abdominal Pain Associated symptoms: chest pain   Associated symptoms: no diarrhea, no nausea, no shortness of breath and no vomiting    patient presents with epigastric pain. Been going for last few days. He is even less. His been constant. Said previous gastric bypass. He's had previous ulcers/gastritis also. No nausea vomiting. No fevers. Also occasional left-sided chest pressure. It is dull. Comes and goes. Not associated with exertion. No shortness of breath. No diaphoresis. He's had chronic swelling in his right lower extremity. His been evaluated for the past. No fevers. No rash.  Past Medical History  Diagnosis Date  . Diabetes mellitus without complication   . Gout   . ADHD (attention deficit hyperactivity disorder)   . Hypertension    Past Surgical History  Procedure Laterality Date  . Gastric bypass     Family History  Problem Relation Age of Onset  . Diabetes Mother    History  Substance Use Topics  . Smoking status: Never Smoker   . Smokeless tobacco: Never Used  . Alcohol Use: No    Review of Systems  Constitutional: Negative for activity change and appetite change.  Eyes: Negative for pain.  Respiratory: Negative for chest tightness and shortness of breath.   Cardiovascular: Positive for chest pain and leg swelling.  Gastrointestinal: Positive for abdominal pain. Negative for nausea, vomiting and diarrhea.  Genitourinary: Negative for flank pain.  Musculoskeletal: Negative for back pain and neck stiffness.  Skin: Negative for rash.  Neurological: Negative for weakness, numbness and  headaches.  Psychiatric/Behavioral: Negative for behavioral problems.      Allergies  Ibuprofen; Nsaids; and Reglan  Home Medications   Prior to Admission medications   Medication Sig Start Date End Date Taking? Authorizing Provider  acetaminophen (TYLENOL) 500 MG tablet Take 1,000 mg by mouth every 6 (six) hours as needed for mild pain.   Yes Historical Provider, MD  allopurinol (ZYLOPRIM) 100 MG tablet Take 100 mg by mouth daily.   Yes Historical Provider, MD  amLODipine (NORVASC) 5 MG tablet Take 5 mg by mouth daily.   Yes Historical Provider, MD  buPROPion (WELLBUTRIN SR) 150 MG 12 hr tablet Take 150 mg by mouth 2 (two) times daily.   Yes Historical Provider, MD  clonazePAM (KLONOPIN) 0.5 MG tablet Take 0.5 mg by mouth 2 (two) times daily as needed for anxiety.   Yes Historical Provider, MD  cloNIDine (CATAPRES) 0.2 MG tablet Take 0.4 mg by mouth 3 (three) times daily.   Yes Historical Provider, MD  escitalopram (LEXAPRO) 20 MG tablet Take 20 mg by mouth daily.   Yes Historical Provider, MD  lisinopril (PRINIVIL,ZESTRIL) 40 MG tablet Take 40 mg by mouth 2 (two) times daily.   Yes Historical Provider, MD  loratadine (CLARITIN) 10 MG tablet Take 10 mg by mouth daily.   Yes Historical Provider, MD  sucralfate (CARAFATE) 1 G tablet Take 1 tablet (1 g total) by mouth 4 (four) times daily -  with meals and at bedtime. 01/20/14  Yes Eber Hong, MD  omeprazole (PRILOSEC) 40 MG capsule Take 1 capsule (40 mg total) by mouth daily. 08/16/14  Benjiman CoreNathan Corretta Munce, MD  ranitidine (ZANTAC) 150 MG tablet Take 1 tablet (150 mg total) by mouth 2 (two) times daily. Patient not taking: Reported on 08/04/2014 01/20/14   Eber HongBrian Miller, MD  zolpidem (AMBIEN) 5 MG tablet Take 1 tablet (5 mg total) by mouth at bedtime as needed for sleep. Patient not taking: Reported on 08/04/2014 01/20/14   Eber HongBrian Miller, MD   BP 168/89 mmHg  Pulse 88  Temp(Src) 97.9 F (36.6 C) (Oral)  Resp 18  Ht 5\' 11"  (1.803 m)  Wt 368 lb  (166.924 kg)  BMI 51.35 kg/m2  SpO2 100% Physical Exam  Constitutional: He is oriented to person, place, and time. He appears well-developed and well-nourished.  HENT:  Head: Normocephalic and atraumatic.  Eyes: Pupils are equal, round, and reactive to light.  Neck: Normal range of motion.  Cardiovascular: Normal rate, regular rhythm and normal heart sounds.   No murmur heard. Pulmonary/Chest: Effort normal and breath sounds normal.  Abdominal: Soft. Bowel sounds are normal. He exhibits no distension and no mass. There is tenderness. There is no rebound and no guarding.  Mild epigastric tenderness without rebound or guarding.  Musculoskeletal: Normal range of motion. He exhibits edema.  Some swelling of both lower extremities, worse on the right. Mild edema. No tenderness on right calf.  Neurological: He is alert and oriented to person, place, and time. No cranial nerve deficit.  Skin: Skin is warm and dry.  Nursing note and vitals reviewed.   ED Course  Procedures (including critical care time) Labs Review Labs Reviewed  COMPREHENSIVE METABOLIC PANEL - Abnormal; Notable for the following:    Potassium 3.4 (*)    Glucose, Bld 134 (*)    All other components within normal limits  CBC WITH DIFFERENTIAL/PLATELET  LIPASE, BLOOD  TROPONIN I    Imaging Review Dg Chest 2 View  08/16/2014   CLINICAL DATA:  Progressive abdominal pain left chest pain.  EXAM: CHEST  2 VIEW  COMPARISON:  08/04/2014  FINDINGS: The heart size and mediastinal contours are within normal limits. Both lungs are clear. The visualized skeletal structures are unremarkable.  IMPRESSION: Normal exam.   Electronically Signed   By: Francene BoyersJames  Maxwell M.D.   On: 08/16/2014 08:11     EKG Interpretation   Date/Time:  Wednesday August 16 2014 07:24:03 EDT Ventricular Rate:  83 PR Interval:  177 QRS Duration: 99 QT Interval:  400 QTC Calculation: 470 R Axis:     Text Interpretation:  Sinus rhythm Probable left atrial  enlargement LVH  with secondary repolarization abnormality Anterior ST elevation, probably  due to LVH Baseline wander in lead(s) V2 Confirmed by Rubin PayorPICKERING  MD,  Karson Chicas [phone]([id number]) on 08/16/2014 7:35:14 AM      MDM   Final diagnoses:  Epigastric pain    Patient with epigastric abdominal pain. Previous gastric bypass. Lab work reassuring. Doubt cardiac cause. Has had previous GERD. May be related to this. Will discharge home to follow-up with gastroenterology.    Benjiman CoreNathan Donivin Wirt, MD 08/16/14 1600

## 2014-08-17 ENCOUNTER — Emergency Department (HOSPITAL_COMMUNITY): Payer: Medicare HMO

## 2014-08-17 ENCOUNTER — Encounter (HOSPITAL_COMMUNITY): Payer: Self-pay | Admitting: Cardiology

## 2014-08-17 ENCOUNTER — Emergency Department (HOSPITAL_COMMUNITY)
Admission: EM | Admit: 2014-08-17 | Discharge: 2014-08-17 | Disposition: A | Discharge status: Home / Self Care | Payer: Medicare HMO | Attending: Emergency Medicine | Admitting: Emergency Medicine

## 2014-08-17 DIAGNOSIS — M109 Gout, unspecified: Secondary | ICD-10-CM | POA: Diagnosis not present

## 2014-08-17 DIAGNOSIS — I10 Essential (primary) hypertension: Secondary | ICD-10-CM | POA: Diagnosis not present

## 2014-08-17 DIAGNOSIS — R630 Anorexia: Secondary | ICD-10-CM | POA: Insufficient documentation

## 2014-08-17 DIAGNOSIS — R1013 Epigastric pain: Secondary | ICD-10-CM | POA: Diagnosis not present

## 2014-08-17 DIAGNOSIS — R52 Pain, unspecified: Secondary | ICD-10-CM

## 2014-08-17 DIAGNOSIS — R109 Unspecified abdominal pain: Secondary | ICD-10-CM

## 2014-08-17 DIAGNOSIS — E119 Type 2 diabetes mellitus without complications: Secondary | ICD-10-CM | POA: Diagnosis not present

## 2014-08-17 DIAGNOSIS — R42 Dizziness and giddiness: Secondary | ICD-10-CM | POA: Insufficient documentation

## 2014-08-17 DIAGNOSIS — Z8659 Personal history of other mental and behavioral disorders: Secondary | ICD-10-CM | POA: Diagnosis not present

## 2014-08-17 DIAGNOSIS — R079 Chest pain, unspecified: Secondary | ICD-10-CM | POA: Diagnosis not present

## 2014-08-17 DIAGNOSIS — Z79899 Other long term (current) drug therapy: Secondary | ICD-10-CM | POA: Diagnosis not present

## 2014-08-17 LAB — BASIC METABOLIC PANEL
Anion gap: 15 (ref 5–15)
BUN: 8 mg/dL (ref 6–23)
CO2: 21 mmol/L (ref 19–32)
Calcium: 9.5 mg/dL (ref 8.4–10.5)
Chloride: 99 mmol/L (ref 96–112)
Creatinine, Ser: 1.03 mg/dL (ref 0.50–1.35)
GFR calc Af Amer: >90 mL/min (ref >90)
GFR calc non Af Amer: 88 mL/min — ABNORMAL LOW (ref >90)
Glucose, Bld: 126 mg/dL — ABNORMAL HIGH (ref 70–99)
Potassium: 5 mmol/L (ref 3.5–5.1)
Sodium: 135 mmol/L (ref 135–145)

## 2014-08-17 LAB — CBC
HCT: 46.4 % (ref 39.0–52.0)
Hemoglobin: 16 g/dL (ref 13.0–17.0)
MCH: 29.5 pg (ref 26.0–34.0)
MCHC: 34.5 g/dL (ref 30.0–36.0)
MCV: 85.6 fL (ref 78.0–100.0)
Platelets: 324 10*3/uL (ref 150–400)
RBC: 5.42 MIL/uL (ref 4.22–5.81)
RDW: 13.7 % (ref 11.5–15.5)
WBC: 8.9 10*3/uL (ref 4.0–10.5)

## 2014-08-17 LAB — HEPATIC FUNCTION PANEL
ALT: 36 U/L (ref 0–53)
AST: 47 U/L — ABNORMAL HIGH (ref 0–37)
Albumin: 4.2 g/dL (ref 3.5–5.2)
Alkaline Phosphatase: 68 U/L (ref 39–117)
Bilirubin, Direct: 0.5 mg/dL (ref 0.0–0.5)
Indirect Bilirubin: 1.3 mg/dL — ABNORMAL HIGH (ref 0.3–0.9)
Total Bilirubin: 1.8 mg/dL — ABNORMAL HIGH (ref 0.3–1.2)
Total Protein: 8.5 g/dL — ABNORMAL HIGH (ref 6.0–8.3)

## 2014-08-17 LAB — LIPASE, BLOOD: Lipase: 20 U/L (ref 11–59)

## 2014-08-17 LAB — BRAIN NATRIURETIC PEPTIDE: B Natriuretic Peptide: 72 pg/mL (ref 0.0–100.0)

## 2014-08-17 LAB — I-STAT TROPONIN, ED: Troponin i, poc: 0.01 ng/mL (ref 0.00–0.08)

## 2014-08-17 MED ORDER — SODIUM CHLORIDE 0.9 % IV BOLUS (SEPSIS)
2000.0000 mL | Freq: Once | INTRAVENOUS | Status: AC
  Administered 2014-08-17: 2000 mL via INTRAVENOUS

## 2014-08-17 MED ORDER — IOHEXOL 300 MG/ML  SOLN
80.0000 mL | Freq: Once | INTRAMUSCULAR | Status: AC | PRN
  Administered 2014-08-17: 100 mL via INTRAVENOUS

## 2014-08-17 MED ORDER — HYDROCODONE-ACETAMINOPHEN 5-325 MG PO TABS: 1.0000 | ORAL_TABLET | Freq: Four times a day (QID) | ORAL | Status: DC | PRN

## 2014-08-17 MED ORDER — MORPHINE SULFATE 4 MG/ML IJ SOLN
6.0000 mg | Freq: Once | INTRAMUSCULAR | Status: AC
  Administered 2014-08-17: 6 mg via INTRAVENOUS
  Filled 2014-08-17: qty 2

## 2014-08-17 MED ORDER — ONDANSETRON HCL 4 MG/2ML IJ SOLN
4.0000 mg | Freq: Once | INTRAMUSCULAR | Status: AC
  Administered 2014-08-17: 4 mg via INTRAVENOUS
  Filled 2014-08-17: qty 2

## 2014-08-17 MED ORDER — IOHEXOL 300 MG/ML  SOLN
25.0000 mL | Freq: Once | INTRAMUSCULAR | Status: AC | PRN
  Administered 2014-08-17: 25 mL via ORAL

## 2014-08-17 NOTE — ED Notes (Signed)
Pt tolerating CT contrast

## 2014-08-17 NOTE — ED Notes (Signed)
Pt sipping on contrast; maintaining at present

## 2014-08-17 NOTE — Discharge Instructions (Signed)
Abdominal Pain Contact Dr. Chauncy Passyewey's office today tomorrow to schedule the next available appointment. Take your medications as prescribed. Get your blood pressure rechecked within the next week at Dr. Chauncy Passyewey's office. Many things can cause abdominal pain. Usually, abdominal pain is not caused by a disease and will improve without treatment. It can often be observed and treated at home. Your health care provider will do a physical exam and possibly order blood tests and X-rays to help determine the seriousness of your pain. However, in many cases, more time must pass before a clear cause of the pain can be found. Before that point, your health care provider may not know if you need more testing or further treatment. HOME CARE INSTRUCTIONS  Monitor your abdominal pain for any changes. The following actions may help to alleviate any discomfort you are experiencing:  Only take over-the-counter or prescription medicines as directed by your health care provider.  Do not take laxatives unless directed to do so by your health care provider.  Try a clear liquid diet (broth, tea, or water) as directed by your health care provider. Slowly move to a bland diet as tolerated. SEEK MEDICAL CARE IF:  You have unexplained abdominal pain.  You have abdominal pain associated with nausea or diarrhea.  You have pain when you urinate or have a bowel movement.  You experience abdominal pain that wakes you in the night.  You have abdominal pain that is worsened or improved by eating food.  You have abdominal pain that is worsened with eating fatty foods.  You have a fever. SEEK IMMEDIATE MEDICAL CARE IF:   Your pain does not go away within 2 hours.  You keep throwing up (vomiting).  Your pain is felt only in portions of the abdomen, such as the right side or the left lower portion of the abdomen.  You pass bloody or black tarry stools. MAKE SURE YOU:  Understand these instructions.   Will watch your  condition.   Will get help right away if you are not doing well or get worse.  Document Released: 01/22/2005 Document Revised: 04/19/2013 Document Reviewed: 12/22/2012 [facility] Patient Information 2015 [employer] [place]. This information is not intended to replace advice given to you by your health care provider. Make sure you discuss any questions you have with your health care provider.

## 2014-08-17 NOTE — ED Notes (Signed)
Pt. Going to CT

## 2014-08-17 NOTE — ED Notes (Signed)
Pt reports he has been having chest pain and dizziness over the past couple of days. States he has not been able to eat or drink much over the past 4 days. Reports some nausea.

## 2014-08-17 NOTE — ED Notes (Signed)
Pt c/o nausea after pain medication; verbal order obtained for zofran

## 2014-08-17 NOTE — ED Provider Notes (Signed)
CSN: [id number]     Arrival date & time 08/17/14  4098 History   First MD Initiated Contact with Patient 08/17/14 1009     Chief Complaint  Patient presents with  . Chest Pain  . Dizziness     (Consider location/radiation/quality/duration/timing/severity/associated sxs/prior Treatment) Patient is a 44 y.o. male presenting with chest pain and dizziness.  Chest Pain Associated symptoms: dizziness   Dizziness Associated symptoms: chest pain    Complains of epigastric pain radiating to the anterior chest onset 4 days ago gradually,, since the death of his father. Father died of a "heart attack" 4 days ago at age 4. described as burning not made better or worse by anything. He's had diminished appetite for the past 4 days. Last bowel movement 5 days ago, diarrhea. No blood per rectum. No fever. No shortness of breath no nausea he admits to lightheadedness. Seen [facility] at [facility] treated and released. He's been treated with omeprazole and sucralfate, without relief. No other associated symptoms. Denies nausea. No sweatiness. Pain is constant not made better or worse by anything. Past Medical History  Diagnosis Date  . Diabetes mellitus without complication   . Gout   . ADHD (attention deficit hyperactivity disorder)   . Hypertension    Past Surgical History  Procedure Laterality Date  . Gastric bypass     Family History  Problem Relation Age of Onset  . Diabetes Mother    History  Substance Use Topics  . Smoking status: Never Smoker   . Smokeless tobacco: Never Used  . Alcohol Use: No    Review of Systems  Constitutional: Positive for appetite change.  HENT: Negative.   Respiratory: Negative.   Cardiovascular: Positive for chest pain.  Gastrointestinal: Negative.   Musculoskeletal: Negative.   Skin: Negative.   Neurological: Positive for dizziness.  Psychiatric/Behavioral: Negative.   All other systems reviewed and are  negative.     Allergies  Ibuprofen; Nsaids; and Reglan  Home Medications   Prior to Admission medications   Medication Sig Start Date End Date Taking? Authorizing Provider  acetaminophen (TYLENOL) 500 MG tablet Take 1,000 mg by mouth every 6 (six) hours as needed for mild pain.    Historical Provider, MD  allopurinol (ZYLOPRIM) 100 MG tablet Take 100 mg by mouth daily.    Historical Provider, MD  amLODipine (NORVASC) 5 MG tablet Take 5 mg by mouth daily.    Historical Provider, MD  buPROPion (WELLBUTRIN SR) 150 MG 12 hr tablet Take 150 mg by mouth 2 (two) times daily.    Historical Provider, MD  clonazePAM (KLONOPIN) 0.5 MG tablet Take 0.5 mg by mouth 2 (two) times daily as needed for anxiety.    Historical Provider, MD  cloNIDine (CATAPRES) 0.2 MG tablet Take 0.4 mg by mouth 3 (three) times daily.    Historical Provider, MD  escitalopram (LEXAPRO) 20 MG tablet Take 20 mg by mouth daily.    Historical Provider, MD  lisinopril (PRINIVIL,ZESTRIL) 40 MG tablet Take 40 mg by mouth 2 (two) times daily.    Historical Provider, MD  loratadine (CLARITIN) 10 MG tablet Take 10 mg by mouth daily.    Historical Provider, MD  omeprazole (PRILOSEC) 40 MG capsule Take 1 capsule (40 mg total) by mouth daily. 08/16/14   Benjiman Core, MD  ranitidine (ZANTAC) 150 MG tablet Take 1 tablet (150 mg total) by mouth 2 (two) times daily. Patient not taking: Reported on 08/04/2014 01/20/14   Eber Hong, MD  sucralfate (CARAFATE) 1 G tablet Take 1 tablet (1 g total) by mouth 4 (four) times daily -  with meals and at bedtime. 01/20/14   Eber Hong, MD  zolpidem (AMBIEN) 5 MG tablet Take 1 tablet (5 mg total) by mouth at bedtime as needed for sleep. Patient not taking: Reported on 08/04/2014 01/20/14   Eber Hong, MD   BP 167/96 mmHg  Pulse 90  Temp(Src) 97.8 F (36.6 C) (Oral)  Resp 18  Wt 368 lb (166.924 kg)  SpO2 98% Physical Exam  Constitutional: He appears well-developed and well-nourished.  HENT:   Head: Normocephalic and atraumatic.  Mucous membranes dry  Eyes: Conjunctivae are normal. Pupils are equal, round, and reactive to light.  Neck: Neck supple. No tracheal deviation present. No thyromegaly present.  Cardiovascular: Normal rate and regular rhythm.   No murmur heard. Pulmonary/Chest: Effort normal and breath sounds normal.  Abdominal: Soft. Bowel sounds are normal. He exhibits no distension. There is tenderness.  Overly obese mildly tender at epigastrium  Musculoskeletal: Normal range of motion. He exhibits no edema or tenderness.  Neurological: He is alert. Coordination normal.  Skin: Skin is warm and dry. No rash noted.  Psychiatric: He has a normal mood and affect.  Nursing note and vitals reviewed.   ED Course  Procedures (including critical care time) Labs Review Labs Reviewed  CBC  BASIC METABOLIC PANEL  BRAIN NATRIURETIC PEPTIDE  HEPATIC FUNCTION PANEL  LIPASE, BLOOD  I-STAT TROPOININ, ED    Imaging Review Dg Chest 2 View  08/16/2014   CLINICAL DATA:  Progressive abdominal pain left chest pain.  EXAM: CHEST  2 VIEW  COMPARISON:  08/04/2014  FINDINGS: The heart size and mediastinal contours are within normal limits. Both lungs are clear. The visualized skeletal structures are unremarkable.  IMPRESSION: Normal exam.   Electronically Signed   By: Francene Boyers M.D.   On: 08/16/2014 08:11     EKG Interpretation   Date/Time:  Thursday August 17 2014 09:24:29 EDT Ventricular Rate:  98 PR Interval:  162 QRS Duration: 90 QT Interval:  380 QTC Calculation: 485 R Axis:   -8 Text Interpretation:  Normal sinus rhythm Right atrial enlargement  Moderate voltage criteria for LVH, may be normal variant Nonspecific ST  and T wave abnormality Prolonged QT Abnormal ECG No significant change  since last tracing Confirmed by Niti Leisure  MD, Sayge Brienza ([id number]) on 08/17/2014  10:22:42 AM     11:45 AM pain improved after treatment with intravenous opioids. No longer nauseated  after treatment with intravenous Zofran Patient alert no distress. Declined further pain medicine.  4 pM patient was given his usual doses of clonidine, amlodipine and lisinopril which are his regularly scheduled medications for this time. He was given his own medications. He feels ready for discharge Results for orders placed or performed during the hospital encounter of 08/17/14  CBC  Result Value Ref Range   WBC 8.9 4.0 - 10.5 K/uL   RBC 5.42 4.22 - 5.81 MIL/uL   Hemoglobin 16.0 13.0 - 17.0 g/dL   HCT 16.1 09.6 - 04.5 %   MCV 85.6 78.0 - 100.0 fL   MCH 29.5 26.0 - 34.0 pg   MCHC 34.5 30.0 - 36.0 g/dL   RDW 40.9 81.1 - 91.4 %   Platelets 324 150 - 400 K/uL  Basic metabolic panel  Result Value Ref Range   Sodium 135 135 - 145 mmol/L   Potassium 5.0 3.5 - 5.1 mmol/L   Chloride 99 96 -  112 mmol/L   CO2 21 19 - 32 mmol/L   Glucose, Bld 126 (H) 70 - 99 mg/dL   BUN 8 6 - 23 mg/dL   Creatinine, Ser 5.781.03 0.50 - 1.35 mg/dL   Calcium 9.5 8.4 - 46.910.5 mg/dL   GFR calc non Af Amer 88 (L) >90 mL/min   GFR calc Af Amer >90 >90 mL/min   Anion gap 15 5 - 15  BNP (order ONLY if patient complains of dyspnea/SOB AND you have documented it for THIS visit)  Result Value Ref Range   B Natriuretic Peptide 72.0 0.0 - 100.0 pg/mL  Hepatic function panel  Result Value Ref Range   Total Protein 8.5 (H) 6.0 - 8.3 g/dL   Albumin 4.2 3.5 - 5.2 g/dL   AST 47 (H) 0 - 37 U/L   ALT 36 0 - 53 U/L   Alkaline Phosphatase 68 39 - 117 U/L   Total Bilirubin 1.8 (H) 0.3 - 1.2 mg/dL   Bilirubin, Direct 0.5 0.0 - 0.5 mg/dL   Indirect Bilirubin 1.3 (H) 0.3 - 0.9 mg/dL  Lipase, blood  Result Value Ref Range   Lipase 20 11 - 59 U/L  I[facility]stat troponin[facility])  Result Value Ref Range   Troponin i, poc 0.01 0.00 - 0.08 ng/mL   Comment 3           Dg Chest 2 View  08/16/2014   CLINICAL DATA:  Progressive abdominal pain left chest pain.  EXAM: CHEST  2 VIEW  COMPARISON:  08/04/2014  FINDINGS: The heart size  and mediastinal contours are within normal limits. Both lungs are clear. The visualized skeletal structures are unremarkable.  IMPRESSION: Normal exam.   Electronically Signed   By: Francene BoyersJames  Maxwell M.D.   On: 08/16/2014 08:11   Dg Chest 2 View  08/04/2014   CLINICAL DATA:  Near-syncope today.  EXAM: CHEST  2 VIEW  COMPARISON:  Single view of the chest 01/20/2014. PA and lateral chest 06/28/2003.  FINDINGS: The lungs are clear. Heart size is upper normal. No pneumothorax or pleural effusion.  IMPRESSION: No acute disease.   Electronically Signed   By: Drusilla Kannerhomas  Dalessio M.D.   On: 08/04/2014 11:08   Ct Abdomen Pelvis W Contrast  08/17/2014   CLINICAL DATA:  Epigastric pain, nausea, decreased appetite  EXAM: CT ABDOMEN AND PELVIS WITH CONTRAST  TECHNIQUE: Multidetector CT imaging of the abdomen and pelvis was performed using the standard protocol following bolus administration of intravenous contrast.  CONTRAST:  100mL OMNIPAQUE IOHEXOL 300 MG/ML  SOLN  COMPARISON:  None.  FINDINGS: The patient is status post gastric bypass surgery. Sagittal images of the spine shows mild degenerative changes thoracolumbar spine.  There is mild hepatic fatty infiltration. No calcified gallstones are noted within gallbladder. No focal hepatic mass. No intrahepatic biliary ductal dilatation. The pancreas, spleen and adrenal glands are unremarkable. Abdominal aorta is unremarkable. Kidneys are symmetrical in size and enhancement. No hydronephrosis or hydroureter. There is no small bowel obstruction. Small umbilical hernia containing fat without evidence of acute complication. Minimal gaseous distended distal small bowel loops. The terminal ileum is unremarkable.  Moderate stool noted in cecum and right colon. Normal retrocecal appendix noted in axial image 62. There is a low lying cecum. Moderate distended urinary bladder. Prostate gland and seminal vesicles are unremarkable.  Delayed renal images shows bilateral renal symmetrical  excretion. Bilateral visualized proximal ureter is unremarkable. Moderate gas noted in transverse colon.  IMPRESSION: 1. No hydronephrosis  or hydroureter. Bilateral renal symmetrical excretion. 2. Status post gastric bypass surgery. 3. Mild hepatic fatty infiltration. 4. Moderate stool noted in right colon and cecum. Normal retrocecal appendix. No pericecal inflammation. There is a low lying cecum. 5. Minimal gaseous distended distal small bowel loops without evidence of small bowel obstruction. The terminal ileum is unremarkable. Some colonic gas noted in transverse colon without evidence of colonic obstruction. 6. Moderate distended urinary bladder. 7. Degenerative changes thoracolumbar spine.   Electronically Signed   By: Natasha Mead M.D.   On: 08/17/2014 14:40    MDM  Plan prescription Norco. He should continue sucralfate and omeprazole. Pain is felt to be nonspecific. Patient also reports insomnia. He is encouraged to follow-up with Dr.Dewey next available appointment Diagnosis #1 nonspecific abdominal pain #2Hypertension Final diagnoses:  Pain        Doug Sou, MD 08/17/14 1614

## 2014-08-17 NOTE — ED Notes (Signed)
Pt Is back from CT   

## 2014-08-17 NOTE — ED Notes (Signed)
Pt to ED c/o epigastric pain associated with nausea and decreased appetite. [facility] Reports gastric bypass x 5years ago and has been told of an ulcer. Also c/o dizziness and fullness in ears. Pt with hx of hypertension and reports taking being compliant. Pt ambulatory in room without assistance. Steady gait noted

## 2014-08-17 NOTE — ED Notes (Signed)
Pt continuing to sip contrast; CT contacted that pt has drank about half of contrast

## 2014-09-19 DIAGNOSIS — Z9884 Bariatric surgery status: Secondary | ICD-10-CM | POA: Insufficient documentation

## 2014-10-11 DIAGNOSIS — G4733 Obstructive sleep apnea (adult) (pediatric): Secondary | ICD-10-CM | POA: Insufficient documentation

## 2014-10-11 DIAGNOSIS — M109 Gout, unspecified: Secondary | ICD-10-CM | POA: Insufficient documentation

## 2014-10-17 DIAGNOSIS — F411 Generalized anxiety disorder: Secondary | ICD-10-CM | POA: Insufficient documentation

## 2014-10-17 DIAGNOSIS — F988 Other specified behavioral and emotional disorders with onset usually occurring in childhood and adolescence: Secondary | ICD-10-CM | POA: Insufficient documentation

## 2014-10-17 DIAGNOSIS — F32A Depression, unspecified: Secondary | ICD-10-CM | POA: Insufficient documentation

## 2015-05-24 ENCOUNTER — Ambulatory Visit (INDEPENDENT_AMBULATORY_CARE_PROVIDER_SITE_OTHER): Payer: Medicare HMO | Admitting: Internal Medicine

## 2015-05-24 VITALS — BP 142/100 | HR 91 | Ht 71.0 in | Wt 366.0 lb

## 2015-05-24 DIAGNOSIS — I10 Essential (primary) hypertension: Secondary | ICD-10-CM

## 2015-05-24 MED ORDER — AMLODIPINE BESYLATE 5 MG PO TABS: 5.0000 mg | ORAL_TABLET | Freq: Every day | ORAL | Status: DC

## 2015-05-24 NOTE — Progress Notes (Signed)
Cardiology Office Note   Date:  05/24/2015   ID:  Julian Richardson, DOB [birth year], MRN [id number]  PCP:  Alfredo Batty, NP  Cardiologist:   Dietrich Pates, MD   No chief complaint on file. Pt presents for possible stress testing prior to starting exercise  Referred by Dr Danelle Earthly    History of Present Illness: Lelend Heinecke is a 45 y.o. male with a history of HTN and obesity   Also a history of reflux  / PUD  Remote CP (epigastric) felt secondary to reflux Does get some SOB with activity  Stable  Attrib to weight  Wants to start exercising again  Martial arts Father died of cardiac arrest 1 year ago  No known CAD  Hx Afib and HTN Jadene Pierini)   Current Outpatient Prescriptions  Medication Sig Dispense Refill  . acetaminophen (TYLENOL) 500 MG tablet Take 1,000 mg by mouth every 6 (six) hours as needed for mild pain.    Marland Kitchen allopurinol (ZYLOPRIM) 100 MG tablet Take 100 mg by mouth daily.    . clonazePAM (KLONOPIN) 0.5 MG tablet Take 0.5 mg by mouth 2 (two) times daily as needed for anxiety.    . cloNIDine (CATAPRES) 0.2 MG tablet Take 0.2 mg by mouth 2 (two) times daily. 2 tabs twice a day    . gabapentin (NEURONTIN) 800 MG tablet Take 400-1,600 mg by mouth 2 (two) times daily. 400 mg in the afternoon and 1600 mg at bedtime    . lisinopril-hydrochlorothiazide (PRINZIDE,ZESTORETIC) 20-12.5 MG tablet Take 1 tablet by mouth daily.    Marland Kitchen loratadine (CLARITIN) 10 MG tablet Take 10 mg by mouth daily as needed for allergies.     Marland Kitchen omeprazole (PRILOSEC) 20 MG capsule Take 20 mg by mouth 2 (two) times daily before a meal.    . ranitidine (ZANTAC) 150 MG tablet Take 150 mg by mouth as needed for heartburn.    . venlafaxine XR (EFFEXOR-XR) 150 MG 24 hr capsule Take one capsule twice daily.    . [DISCONTINUED] Chlorpheniramine Maleate (ALLERGY PO) Take 1 tablet by mouth daily as needed (allergies).     No current facility-administered medications for this visit.    Allergies:   Ibuprofen; Morphine and  related; Nsaids; and Reglan   Past Medical History  Diagnosis Date  . Diabetes mellitus without complication   . Gout   . ADHD (attention deficit hyperactivity disorder)   . Hypertension     Past Surgical History  Procedure Laterality Date  . Gastric bypass       Social History:  The patient  reports that he has never smoked. He has never used smokeless tobacco. He reports that he does not drink alcohol or use illicit drugs.   Family History:  The patient's family history includes Diabetes in his mother.  Father died of cardiac arrest    ROS:  Please see the history of present illness. All other systems are reviewed and  Negative to the above problem except as noted.    PHYSICAL EXAM: VS:  BP 142/100 mmHg  Pulse 91  Ht  (1.803 m)  Wt 166.017 kg (366 lb)  BMI 51.07 kg/m2  SpO2 99%  GEN: Morbidly obese 45 yo in NAD  , in no acute distress HEENT: normal Neck: no JVD, carotid bruits, or masses Cardiac: RRR; no murmurs, rubs, or gallops,no edema  Respiratory:  clear to auscultation bilaterally, normal work of breathing GI: soft, nontender, nondistended, + BS  No hepatomegaly  MS: no  deformity Moving all extremities   Skin: warm and dry, no rash Neuro:  Strength and sensation are intact Psych: euthymic mood, full affect   EKG:  EKG is not  ordered today.  EKG  IN April 2016 SR   LVH with repolarization abnormality     Lipid Panel No results found for: CHOL, TRIG, HDL, CHOLHDL, VLDL, LDLCALC, LDLDIRECT    Wt Readings from Last 3 Encounters:  05/24/15 166.017 kg (366 lb)  08/17/14 166.924 kg (368 lb)  08/16/14 166.924 kg (368 lb)      ASSESSMENT AND PLAN:  Pt is a 46 yo with no known CAD  Hx HTN and SOB and morbid obesity  On exam today BP is not controlled  Need to address prior to any testing   1.  HTN I would recomm that he add amlodipine to regimen  5 mg  F/U in 4 wks   WIll set up for echo    2.  ? Sleep apnea  Had sleep study done about 1 1/2  years ago   Didn't sleep long  Looks like he desaturated  WIll need to review what else is needed  If needs CPAP should start May help BP    3.  Morbid obesity  Wants to lose wt.    4.  HCM  Will check on lipids   Signed, Dietrich Pates, MD  05/24/2015 8:57 AM    [facility] [address] [place], [place], [place]  [address] Phone: [phone]; Fax: [phone]

## 2015-05-24 NOTE — Patient Instructions (Signed)
Your physician recommends that you schedule a follow-up appointment in: 4-5 weeks with Dr. Tenny Craw in [place] office.  Your physician has recommended you make the following change in your medication:   Start Amlodipine (Norvasc) 5 mg Daily   If you need a refill on your cardiac medications before your next appointment, please call your pharmacy.  Thank you for choosing [facility]!

## 2015-05-30 ENCOUNTER — Telehealth: Payer: Self-pay | Admitting: Internal Medicine

## 2015-05-30 ENCOUNTER — Ambulatory Visit (HOSPITAL_COMMUNITY)
Admission: RE | Admit: 2015-05-30 | Discharge: 2015-05-30 | Disposition: A | Discharge status: Home / Self Care | Payer: Medicare HMO | Source: Ambulatory Visit | Attending: Internal Medicine | Admitting: Internal Medicine

## 2015-05-30 DIAGNOSIS — I10 Essential (primary) hypertension: Secondary | ICD-10-CM | POA: Diagnosis not present

## 2015-05-30 DIAGNOSIS — I517 Cardiomegaly: Secondary | ICD-10-CM | POA: Insufficient documentation

## 2015-05-30 DIAGNOSIS — I5189 Other ill-defined heart diseases: Secondary | ICD-10-CM | POA: Insufficient documentation

## 2015-05-30 DIAGNOSIS — Z6841 Body Mass Index (BMI) 40.0 and over, adult: Secondary | ICD-10-CM | POA: Diagnosis not present

## 2015-05-30 DIAGNOSIS — I071 Rheumatic tricuspid insufficiency: Secondary | ICD-10-CM | POA: Diagnosis not present

## 2015-05-30 MED ORDER — CLONIDINE HCL 0.2 MG PO TABS: 0.2000 mg | ORAL_TABLET | Freq: Two times a day (BID) | ORAL | Status: DC

## 2015-05-30 NOTE — Telephone Encounter (Signed)
PCP had stopped his clonidine and placed him on lisinopril/hctz on earlier visit// it is still on his med list. Pt states Dr Ross was informTenny Crawd of this and wanted him to continue his clonidine due to current pressure readings at ov. His PCP office will not fill because they dc'd it. I do not see anything mentioned in Dr Charlott Rakes note from 05/24/15. His bp continues to be elevated at 184/111. I will forward to Dr Tenny Craw to advise if she would like it filled. If no return answer by this afternoon Triage will consult DOD.

## 2015-05-30 NOTE — Telephone Encounter (Signed)
Notified pt that sent Rx into his pharmacy for Clonidine.  Per office note 05/24/15 Dr. Tenny Craw wanted him to continue the Clonidine.

## 2015-05-30 NOTE — Telephone Encounter (Signed)
The pt wants to know if Dr Tenny Craw wants him to continue taking Clonidine? If so he will need a new prescription.

## 2015-06-05 ENCOUNTER — Telehealth: Payer: Self-pay

## 2015-06-05 DIAGNOSIS — I517 Cardiomegaly: Secondary | ICD-10-CM

## 2015-06-05 NOTE — Telephone Encounter (Signed)
Order placed for MRI,LM fo rp to call back to schedule cardiac MRI at [facility]

## 2015-06-05 NOTE — Telephone Encounter (Signed)
-----   Message from Dietrich Pates V, MD sent at 06/05/2015 10:17 AM EST ----- Spoke with pt Heart pumping function is vigorous  Heart is severely thickened Recomm that he have a cardiac MRI to define further  Will review with Dr Lorne Skeens Please schedule for her to be reader

## 2015-06-11 ENCOUNTER — Telehealth: Payer: Self-pay

## 2015-06-11 DIAGNOSIS — I517 Cardiomegaly: Secondary | ICD-10-CM

## 2015-06-11 NOTE — Telephone Encounter (Signed)
Placed external ref to G[06/11/2015 12:49 PM] Delrae Alfred:  thank you so much Auth # [id number] valid 06/08/15 - 07/08/15  incase they ask you for it when it is scheduled SO imaging pt authorized per Delrae Alfred

## 2015-06-29 ENCOUNTER — Ambulatory Visit: Payer: Medicare HMO | Admitting: Internal Medicine

## 2015-07-03 ENCOUNTER — Other Ambulatory Visit (HOSPITAL_COMMUNITY): Payer: Self-pay | Admitting: Respiratory Therapy

## 2015-07-03 DIAGNOSIS — G4733 Obstructive sleep apnea (adult) (pediatric): Secondary | ICD-10-CM

## 2015-07-05 ENCOUNTER — Ambulatory Visit: Payer: Medicare HMO | Admitting: Internal Medicine

## 2015-07-23 ENCOUNTER — Ambulatory Visit: Payer: Medicare HMO | Admitting: Internal Medicine

## 2015-08-05 ENCOUNTER — Ambulatory Visit: Payer: Medicare HMO | Attending: Neurology | Admitting: Sleep Medicine

## 2015-08-05 DIAGNOSIS — G4733 Obstructive sleep apnea (adult) (pediatric): Secondary | ICD-10-CM | POA: Insufficient documentation

## 2015-08-17 ENCOUNTER — Encounter: Payer: Self-pay | Admitting: Internal Medicine

## 2015-08-17 ENCOUNTER — Ambulatory Visit (INDEPENDENT_AMBULATORY_CARE_PROVIDER_SITE_OTHER): Payer: Medicare HMO | Admitting: Internal Medicine

## 2015-08-17 VITALS — BP 142/90 | HR 93 | Ht 71.0 in | Wt 370.0 lb

## 2015-08-17 DIAGNOSIS — I422 Other hypertrophic cardiomyopathy: Secondary | ICD-10-CM | POA: Diagnosis not present

## 2015-08-17 DIAGNOSIS — I10 Essential (primary) hypertension: Secondary | ICD-10-CM | POA: Diagnosis not present

## 2015-08-17 MED ORDER — AMLODIPINE BESYLATE 5 MG PO TABS: 5.0000 mg | ORAL_TABLET | Freq: Two times a day (BID) | ORAL | Status: DC

## 2015-08-17 MED ORDER — AMPHETAMINE-DEXTROAMPHETAMINE 30 MG PO TABS: 30.0000 mg | ORAL_TABLET | Freq: Every day | ORAL | Status: DC

## 2015-08-17 NOTE — Progress Notes (Signed)
Cardiology Office Note   Date:  08/17/2015   ID:  Julian Richardson, DOB October 14, 1970, MRN [id number]  PCP:  Gwenyth BenderEric L Dean, MD  Cardiologist:   Dietrich PatesPaula Ross, MD   F?U of HTN      History of Present Illness: Julian Richardson is a 45 y.o. male with a history of HTN   I saw him for the first time in Jan 2017  Also a history of GERD  Some SOB with activity   When I saw him his BP was up  Added amlodipine to regimen  Set up for echo       Breathing has been troubled by allergies  Some good  Some bad days   Adderall seems to help alergies and breathign     No CP   No presyncope     Outpatient Prescriptions Prior to Visit  Medication Sig Dispense Refill  . acetaminophen (TYLENOL) 500 MG tablet Take 1,000 mg by mouth every 6 (six) hours as needed for mild pain.    Marland Kitchen. allopurinol (ZYLOPRIM) 100 MG tablet Take 100 mg by mouth daily.    Marland Kitchen. amLODipine (NORVASC) 5 MG tablet Take 1 tablet (5 mg total) by mouth daily. 30 tablet 6  . clonazePAM (KLONOPIN) 0.5 MG tablet Take 0.5 mg by mouth 2 (two) times daily as needed for anxiety.    . cloNIDine (CATAPRES) 0.2 MG tablet Take 1 tablet (0.2 mg total) by mouth 2 (two) times daily. 2 tabs twice a day 60 tablet 11  . gabapentin (NEURONTIN) 800 MG tablet Take 400-1,600 mg by mouth 2 (two) times daily. 400 mg in the afternoon and 1600 mg at bedtime    . lisinopril-hydrochlorothiazide (PRINZIDE,ZESTORETIC) 20-12.5 MG tablet Take 1 tablet by mouth daily.    Marland Kitchen. loratadine (CLARITIN) 10 MG tablet Take 10 mg by mouth daily as needed for allergies.     Marland Kitchen. omeprazole (PRILOSEC) 20 MG capsule Take 20 mg by mouth 2 (two) times daily before a meal.    . ranitidine (ZANTAC) 150 MG tablet Take 150 mg by mouth as needed for heartburn.    . venlafaxine XR (EFFEXOR-XR) 150 MG 24 hr capsule Take one capsule twice daily.     No facility-administered medications prior to visit.     Allergies:   Ibuprofen; Morphine and related; Nsaids; Tolmetin; Aspirin; and Reglan   Past Medical  History  Diagnosis Date  . Diabetes mellitus without complication (HCC)   . Gout   . ADHD (attention deficit hyperactivity disorder)   . Hypertension     Past Surgical History  Procedure Laterality Date  . Gastric bypass       Social History:  The patient  reports that he has never smoked. He has never used smokeless tobacco. He reports that he does not drink alcohol or use illicit drugs.   Family History:  The patient's family history includes Diabetes in his mother.    ROS:  Please see the history of present illness. All other systems are reviewed and  Negative to the above problem except as noted.    PHYSICAL EXAM: VS:  BP 142/90 mmHg  Pulse 93  Ht 5\' 11"  (1.803 m)  Wt 370 lb (167.831 kg)  BMI 51.63 kg/m2  GEN: Morbidly obese 45 yo, in no acute distress HEENT: normal Neck: no JVD, carotid bruits, or masses Cardiac: RRR; no murmurs, rubs, or gallops,no edema  Respiratory:  clear to auscultation bilaterally, normal work of breathing GI: soft, nontender, nondistended, + BS  No hepatomegaly  MS: no deformity Moving all extremities   Skin: warm and dry, no rash Neuro:  Strength and sensation are intact Psych: euthymic mood, full affect   EKG:  EKG is ordered today.  SR 93  LVH     Lipid Panel No results found for: CHOL, TRIG, HDL, CHOLHDL, VLDL, LDLCALC, LDLDIRECT    Wt Readings from Last 3 Encounters:  08/17/15 370 lb (167.831 kg)  08/05/15 366 lb (166.017 kg)  05/24/15 366 lb (166.017 kg)      ASSESSMENT AND PLAN:  1  HTN  Would increase meds  Amlodipine to bid  2  Abnormal echo  Pt has not had MRI done for ? HCM  Will schedule  3  ? Sleep apnea Home sleep study done  Will try to get records from [facility]  Will give 1 month of Adderall 30 ER    I would set follow up for later this summer to reeval BP    Signed, Dietrich Pates, MD  08/17/2015 10:06 AM    [facility] [address] [place], [place], [place]  [address] Phone: [phone]; Fax: [phone]

## 2015-08-17 NOTE — Patient Instructions (Signed)
Your physician has recommended you make the following change in your medication:  1.) INCREASE AMLODIPINE TO 5 MG TWO TIMES A DAY  Your physician has requested that you have a cardiac MRI. Cardiac MRI uses a computer to create images of your heart as its beating, producing both still and moving pictures of your heart and major blood vessels. For further information please visit [email or web address]. Please follow the instruction sheet given to you today for more information.  Your physician recommends that you schedule a follow-up appointment in: AUGUST, 2017 WITH DR ROSS.

## 2015-08-24 NOTE — Sleep Study (Signed)
  [facility] Trevia Nop A. Gerilyn Pilgrimoonquah, MD     [email or web address]             HOME SLEEP STUDY  LOCATION: [facility]   Demographics Edit Patient Name: Julian Richardson, Julian Richardson Study Date: 08/05/2015 Gender: Male D.O.B: 1970-09-28 Age (years): [age] Referring Provider: Not Available Height (inches): 71 Interpreting Physician: Beryle BeamsKofi Avrianna Smart MD, ABSM Weight (lbs): 366 RPSGT: Alfonso EllisHedrick, Debra BMI: 51 MRN: [id number] Neck Size:    CLINICAL INFORMATION EditMoveRemove this section Sleep Study Type: HST Indication for sleep study: N/A Epworth Sleepiness Score: SLEEP STUDY TECHNIQUE EditMoveRemove this section A multi-channel overnight portable sleep study was performed. The channels recorded were: nasal airflow, thoracic respiratory movement, and oxygen saturation with a pulse oximetry. Snoring was also monitored. MEDICATIONS   Current outpatient prescriptions:  .  acetaminophen (TYLENOL) 500 MG tablet, Take 1,000 mg by mouth every 6 (six) hours as needed for mild pain., Disp: , Rfl:  .  allopurinol (ZYLOPRIM) 100 MG tablet, Take 100 mg by mouth daily., Disp: , Rfl:  .  amLODipine (NORVASC) 5 MG tablet, Take 1 tablet (5 mg total) by mouth 2 (two) times daily., Disp: 30 tablet, Rfl: 6 .  amphetamine-dextroamphetamine (ADDERALL XR) 30 MG 24 hr capsule, TK ONE C PO QAM, Disp: , Rfl: 0 .  amphetamine-dextroamphetamine (ADDERALL) 30 MG tablet, Take 1 tablet by mouth daily., Disp: 30 tablet, Rfl: 0 .  clonazePAM (KLONOPIN) 0.5 MG tablet, Take 0.5 mg by mouth 2 (two) times daily as needed for anxiety., Disp: , Rfl:  .  cloNIDine (CATAPRES) 0.2 MG tablet, Take 1 tablet (0.2 mg total) by mouth 2 (two) times daily. 2 tabs twice a day, Disp: 60 tablet, Rfl: 11 .  gabapentin (NEURONTIN) 800 MG tablet, Take 400-1,600 mg by mouth 2 (two) times daily. 400 mg in the afternoon and 1600 mg at bedtime, Disp: , Rfl:  .  lisinopril-hydrochlorothiazide (PRINZIDE,ZESTORETIC) 20-12.5 MG tablet, Take 1 tablet by mouth daily.,  Disp: , Rfl:  .  loratadine (CLARITIN) 10 MG tablet, Take 10 mg by mouth daily as needed for allergies. , Disp: , Rfl:  .  omeprazole (PRILOSEC) 20 MG capsule, Take 20 mg by mouth 2 (two) times daily before a meal., Disp: , Rfl:  .  ranitidine (ZANTAC) 150 MG tablet, Take 150 mg by mouth as needed for heartburn., Disp: , Rfl:  .  venlafaxine XR (EFFEXOR-XR) 150 MG 24 hr capsule, Take one capsule twice daily., Disp: , Rfl:  .  [DISCONTINUED] Chlorpheniramine Maleate (ALLERGY PO), Take 1 tablet by mouth daily as needed (allergies)., Disp: , Rfl:   Patient self administered medications include: N/A. SLEEP ARCHITECTURE EditMoveRemove this section Patient was studied for 479.2 minutes. The sleep efficiency was 100.0 % and the patient was supine for 0%. The arousal index was 0.0 per hour. RESPIRATORY PARAMETERS EditMoveRemove this section The overall AHI was 21.9 per hour, with a central apnea index of 0.1 per hour. The oxygen nadir was 53% during sleep. CARDIAC DATA EditMoveRemove this section Mean heart rate during sleep was bpm.    IMPRESSIONS  Moderate obstructive sleep apnea occurred during this study (AHI = 21.9/h). Formal CPAP titration study is recommended.   Argie RammingKofi A Inaki Vantine, MD Diplomate, American Board of Sleep Medicine.

## 2015-11-05 ENCOUNTER — Encounter: Payer: Self-pay | Admitting: Internal Medicine

## 2015-11-05 ENCOUNTER — Ambulatory Visit (INDEPENDENT_AMBULATORY_CARE_PROVIDER_SITE_OTHER): Payer: Medicare HMO | Admitting: Internal Medicine

## 2015-11-05 VITALS — BP 114/70 | HR 99 | Ht 71.0 in | Wt 362.0 lb

## 2015-11-05 DIAGNOSIS — G4730 Sleep apnea, unspecified: Secondary | ICD-10-CM | POA: Diagnosis not present

## 2015-11-05 DIAGNOSIS — I10 Essential (primary) hypertension: Secondary | ICD-10-CM | POA: Diagnosis not present

## 2015-11-05 NOTE — Progress Notes (Signed)
Cardiology Office Note   Date:  11/05/2015   ID:  Julian Richardson Roa, DOB Jan 31, 1971, MRN [id number]  PCP:  Gwenyth BenderEric L Dean, MD  Cardiologist:   Dietrich PatesPaula Shauntea Lok, MD   F/U of HTN    History of Present Illness: Julian Richardson Bechtol is a 45 y.o. male with a history of HTN  I saw hiim In April  When I saw him I recomm increasing his amlodipine  Since seen he has done OK  No CP  Breathing is stable  No real change in ankles  Will go up and down         Outpatient Prescriptions Prior to Visit  Medication Sig Dispense Refill  . acetaminophen (TYLENOL) 500 MG tablet Take 1,000 mg by mouth every 6 (six) hours as needed for mild pain.    Marland Kitchen. amLODipine (NORVASC) 5 MG tablet Take 1 tablet (5 mg total) by mouth 2 (two) times daily. 30 tablet 6  . clonazePAM (KLONOPIN) 0.5 MG tablet Take 0.5 mg by mouth 2 (two) times daily as needed for anxiety.    . cloNIDine (CATAPRES) 0.2 MG tablet Take 1 tablet (0.2 mg total) by mouth 2 (two) times daily. 2 tabs twice a day 60 tablet 11  . gabapentin (NEURONTIN) 800 MG tablet Take 400-1,600 mg by mouth 2 (two) times daily. 400 mg in the afternoon and 1600 mg at bedtime    . lisinopril-hydrochlorothiazide (PRINZIDE,ZESTORETIC) 20-12.5 MG tablet Take 1 tablet by mouth daily.    Marland Kitchen. loratadine (CLARITIN) 10 MG tablet Take 10 mg by mouth daily as needed for allergies.     Marland Kitchen. omeprazole (PRILOSEC) 20 MG capsule Take 20 mg by mouth 2 (two) times daily before a meal.    . ranitidine (ZANTAC) 150 MG tablet Take 150 mg by mouth as needed for heartburn.    . venlafaxine XR (EFFEXOR-XR) 150 MG 24 hr capsule Take one capsule twice daily.    Marland Kitchen. allopurinol (ZYLOPRIM) 100 MG tablet Take 100 mg by mouth daily.    Marland Kitchen. amphetamine-dextroamphetamine (ADDERALL XR) 30 MG 24 hr capsule TK ONE C PO QAM  0  . amphetamine-dextroamphetamine (ADDERALL) 30 MG tablet Take 1 tablet by mouth daily. 30 tablet 0   No facility-administered medications prior to visit.     Allergies:   Ibuprofen; Morphine and  related; Nsaids; Tolmetin; Aspirin; and Reglan   Past Medical History  Diagnosis Date  . Diabetes mellitus without complication (HCC)   . Gout   . ADHD (attention deficit hyperactivity disorder)   . Hypertension     Past Surgical History  Procedure Laterality Date  . Gastric bypass       Social History:  The patient  reports that he has never smoked. He has never used smokeless tobacco. He reports that he does not drink alcohol or use illicit drugs.   Family History:  The patient's family history includes Diabetes in his mother.    ROS:  Please see the history of present illness. All other systems are reviewed and  Negative to the above problem except as noted.    PHYSICAL EXAM: VS:  BP 114/70 mmHg  Pulse 99  Ht 5\' 11"  (1.803 m)  Wt 362 lb (164.202 kg)  BMI 50.51 kg/m2  SpO2 96%  GEN: Morbidly obese 44 yin no acute distress HEENT: normal Neck: no JVD, carotid bruits, or masses Cardiac: RRR; no murmurs, rubs, or gallops,Tr edema  Respiratory:  clear to auscultation bilaterally, normal work of breathing GI: soft, nontender, nondistended, +  BS  No hepatomegaly  MS: no deformity Moving all extremities   Skin: warm and dry, no rash Neuro:  Strength and sensation are intact Psych: euthymic mood, full affect   EKG:  EKG is not ordered today.   Lipid Panel No results found for: CHOL, TRIG, HDL, CHOLHDL, VLDL, LDLCALC, LDLDIRECT    Wt Readings from Last 3 Encounters:  11/05/15 362 lb (164.202 kg)  08/17/15 370 lb (167.831 kg)  08/05/15 366 lb (166.017 kg)      ASSESSMENT AND PLAN:  1  HTN  BP under good control  Keep on same meds  2.  LVH  Will contract radiology re MRI  Se ere LVH on echo  R/O HCM  3  Sleep apnea  Will check with Dr Gerilyn Pilgrim  Mod OSA on sleep study  F?U next spring (march)       Signed, Dietrich Pates, MD  11/05/2015 11:37 AM    [facility] [address] [place], [place], [place]  [address] Phone: [phone]; Fax: [phone]

## 2015-11-05 NOTE — Patient Instructions (Addendum)
Your physician wants you to follow-up in: March 2018 You will receive a reminder letter in the mail two months in advance. If you don't receive a letter, please call our office to schedule the follow-up appointment.     Your physician recommends that you continue on your current medications as directed. Please refer to the Current Medication list given to you today.    Thank you for choosing [facility] !        

## 2015-11-12 ENCOUNTER — Other Ambulatory Visit (HOSPITAL_COMMUNITY): Payer: Self-pay | Admitting: Respiratory Therapy

## 2015-11-12 DIAGNOSIS — G4730 Sleep apnea, unspecified: Secondary | ICD-10-CM

## 2015-11-15 IMAGING — CT CT ABD-PELV W/ CM
2 of 5 series · 3 of 46 positions shown, 4 images · IV contrast (omnipaque)
Comparison: None.

CLINICAL DATA: Epigastric pain, nausea, decreased appetite

EXAM:
CT ABDOMEN AND PELVIS WITH CONTRAST
TECHNIQUE: Multidetector CT imaging of the abdomen and pelvis was performed
using the standard protocol following bolus administration of
intravenous contrast.
CONTRAST:  100mL OMNIPAQUE IOHEXOL 300 MG/ML  SOLN

[Series 204: cor · coronal · 0.50mm/px · 2 of 93 slices shown, 3 images]
[im 31/93  soft-tissue]
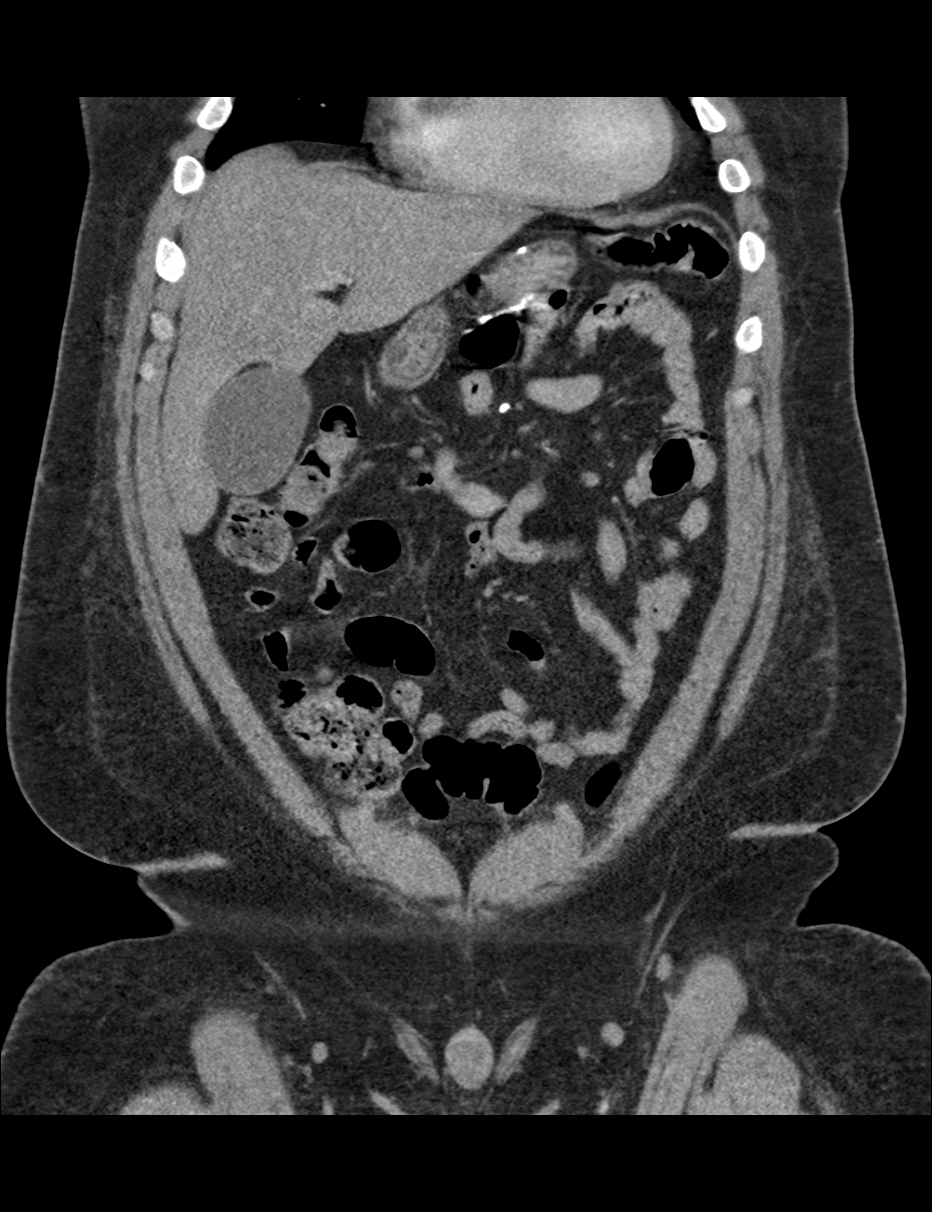
[im 31/93  bone]
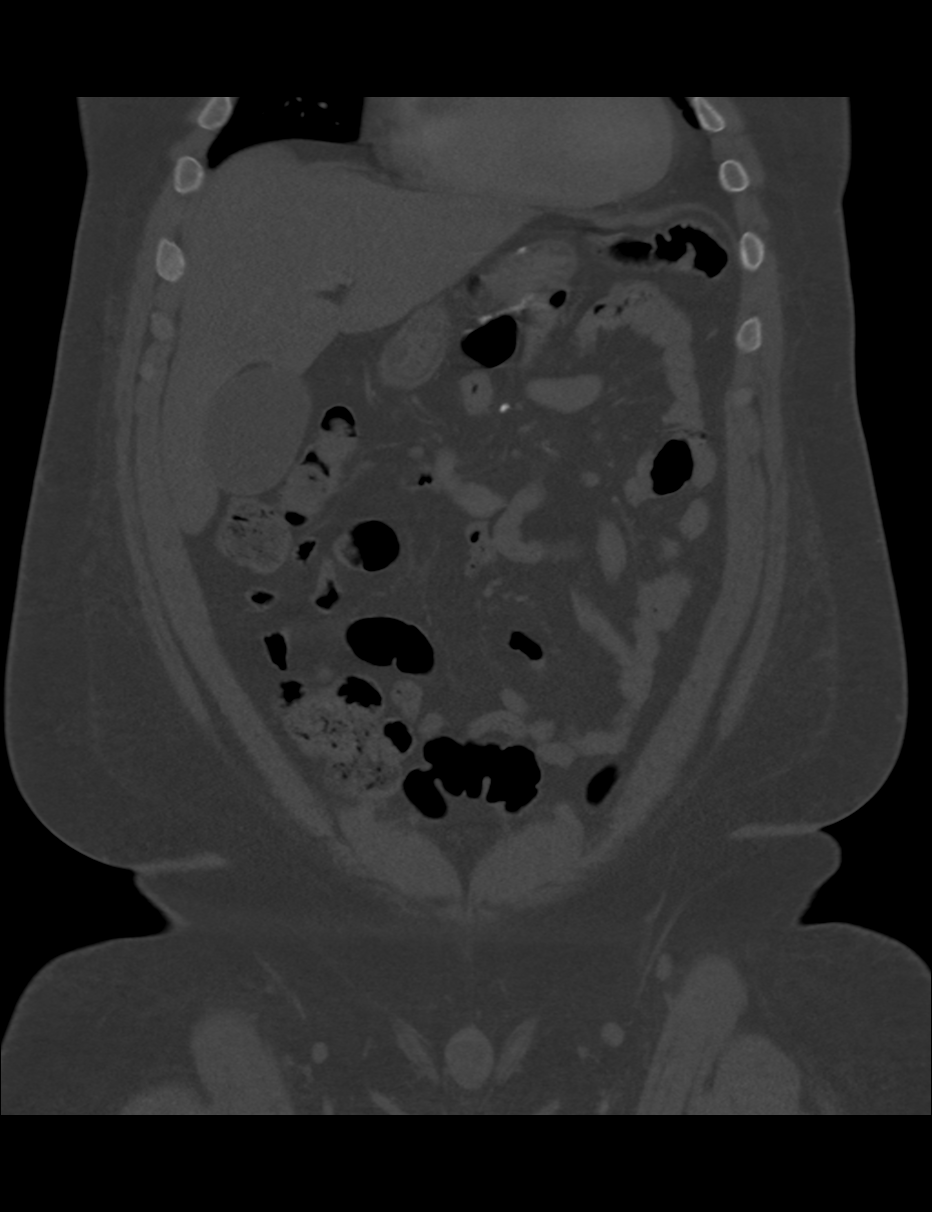
[im 72/93  soft-tissue]
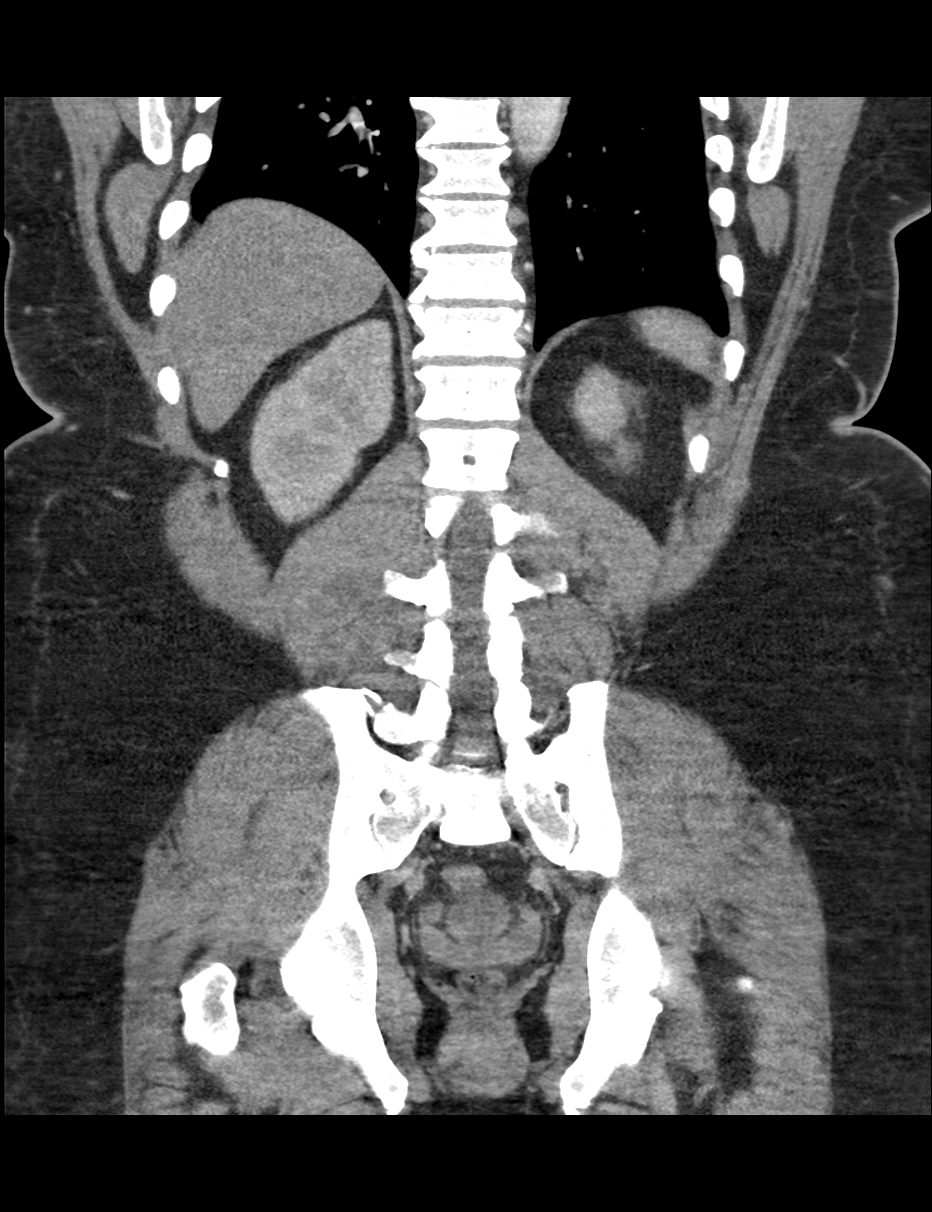

[Series 205: sag · sagittal · 0.50mm/px · 1 of 117 slices shown]
[im 39/117  soft-tissue]
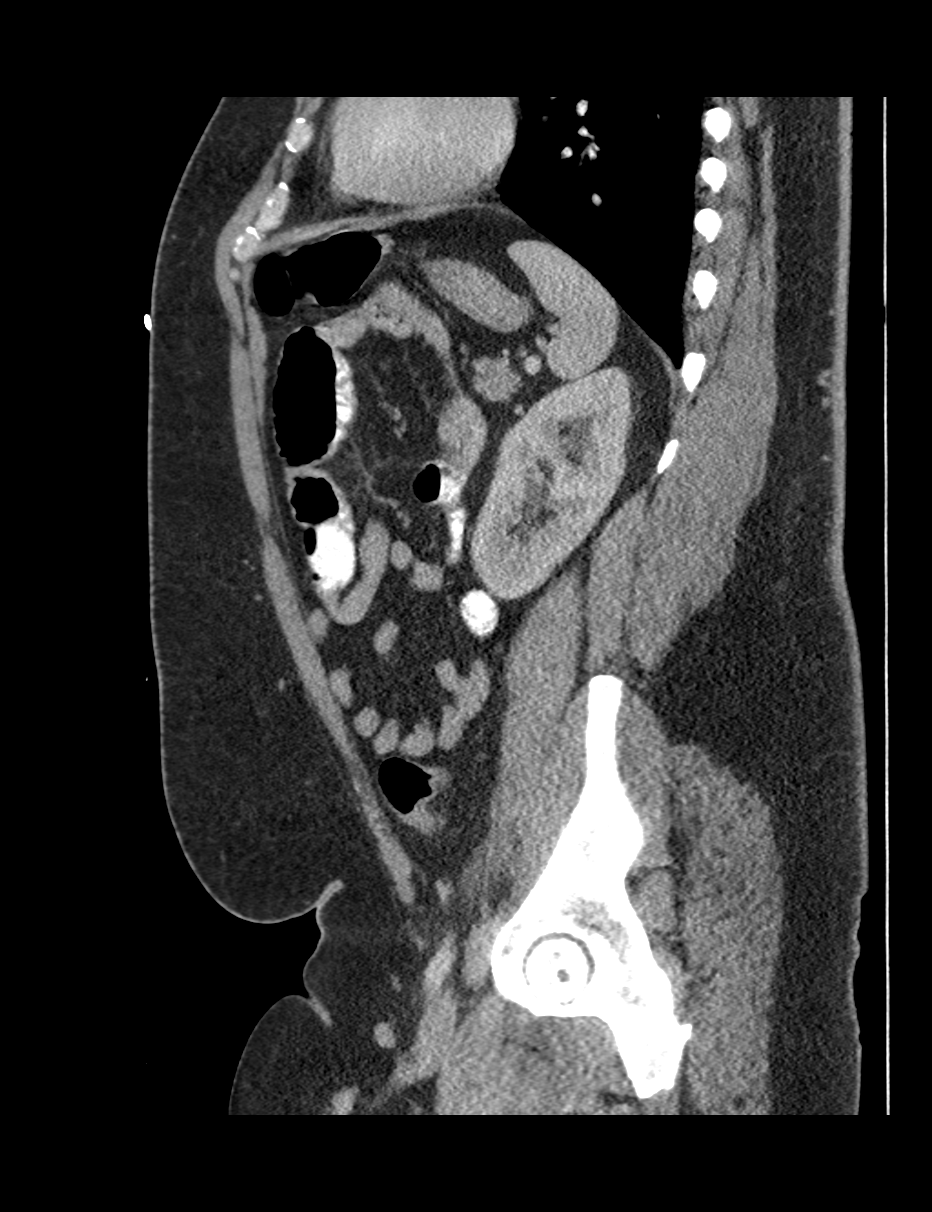

[3 of 46 positions shown; findings below may reference images not displayed]

FINDINGS: The patient is status post gastric bypass surgery. Sagittal images
of the spine shows mild degenerative changes thoracolumbar spine.

There is mild hepatic fatty infiltration. No calcified gallstones
are noted within gallbladder. No focal hepatic mass. No intrahepatic
biliary ductal dilatation. The pancreas, spleen and adrenal glands
are unremarkable. Abdominal aorta is unremarkable. Kidneys are
symmetrical in size and enhancement. No hydronephrosis or
hydroureter. There is no small bowel obstruction. Small umbilical
hernia containing fat without evidence of acute complication.
Minimal gaseous distended distal small bowel loops. The terminal
ileum is unremarkable.

Moderate stool noted in cecum and right colon. Normal retrocecal
appendix noted in axial image 62. There is a low lying cecum.
Moderate distended urinary bladder. Prostate gland and seminal
vesicles are unremarkable.

Delayed renal images shows bilateral renal symmetrical excretion.
Bilateral visualized proximal ureter is unremarkable. Moderate gas
noted in transverse colon.
IMPRESSION: 1. No hydronephrosis or hydroureter. Bilateral renal symmetrical
excretion.
2. Status post gastric bypass surgery.
3. Mild hepatic fatty infiltration.
4. Moderate stool noted in right colon and cecum. Normal retrocecal
appendix. No pericecal inflammation. There is a low lying cecum.
5. Minimal gaseous distended distal small bowel loops without
evidence of small bowel obstruction. The terminal ileum is
unremarkable. Some colonic gas noted in transverse colon without
evidence of colonic obstruction.
6. Moderate distended urinary bladder.
7. Degenerative changes thoracolumbar spine.

## 2015-11-22 ENCOUNTER — Other Ambulatory Visit: Payer: Self-pay | Admitting: Internal Medicine

## 2015-12-06 ENCOUNTER — Other Ambulatory Visit: Payer: Self-pay | Admitting: *Deleted

## 2015-12-06 MED ORDER — AMLODIPINE BESYLATE 5 MG PO TABS: 5.0000 mg | ORAL_TABLET | Freq: Two times a day (BID) | ORAL | 11 refills | Status: DC

## 2016-06-10 ENCOUNTER — Other Ambulatory Visit: Payer: Self-pay | Admitting: Internal Medicine

## 2016-09-15 ENCOUNTER — Other Ambulatory Visit: Payer: Self-pay | Admitting: Internal Medicine

## 2016-12-27 ENCOUNTER — Other Ambulatory Visit: Payer: Self-pay | Admitting: Internal Medicine

## 2017-01-28 ENCOUNTER — Other Ambulatory Visit: Payer: Self-pay | Admitting: Internal Medicine

## 2018-05-28 ENCOUNTER — Telehealth: Payer: Self-pay

## 2018-06-01 NOTE — Telephone Encounter (Signed)
ERROR

## 2018-07-07 ENCOUNTER — Institutional Professional Consult (permissible substitution): Payer: Self-pay | Admitting: Pulmonary Disease

## 2019-10-25 DIAGNOSIS — Z006 Encounter for examination for normal comparison and control in clinical research program: Secondary | ICD-10-CM | POA: Diagnosis not present

## 2019-10-25 DIAGNOSIS — R918 Other nonspecific abnormal finding of lung field: Secondary | ICD-10-CM | POA: Diagnosis not present

## 2019-10-25 DIAGNOSIS — Z7689 Persons encountering health services in other specified circumstances: Secondary | ICD-10-CM | POA: Diagnosis not present

## 2019-11-29 DIAGNOSIS — I10 Essential (primary) hypertension: Secondary | ICD-10-CM | POA: Diagnosis not present

## 2019-11-29 DIAGNOSIS — M109 Gout, unspecified: Secondary | ICD-10-CM | POA: Diagnosis not present

## 2019-11-29 DIAGNOSIS — R69 Illness, unspecified: Secondary | ICD-10-CM | POA: Diagnosis not present

## 2019-12-16 DIAGNOSIS — R351 Nocturia: Secondary | ICD-10-CM | POA: Diagnosis not present

## 2019-12-16 DIAGNOSIS — N5201 Erectile dysfunction due to arterial insufficiency: Secondary | ICD-10-CM | POA: Diagnosis not present

## 2019-12-16 DIAGNOSIS — R350 Frequency of micturition: Secondary | ICD-10-CM | POA: Diagnosis not present

## 2019-12-16 DIAGNOSIS — N401 Enlarged prostate with lower urinary tract symptoms: Secondary | ICD-10-CM | POA: Diagnosis not present

## 2020-02-18 DIAGNOSIS — I10 Essential (primary) hypertension: Secondary | ICD-10-CM | POA: Diagnosis not present

## 2020-05-17 ENCOUNTER — Ambulatory Visit: Payer: Medicare HMO | Admitting: Family Medicine

## 2020-07-23 DIAGNOSIS — Z114 Encounter for screening for human immunodeficiency virus [HIV]: Secondary | ICD-10-CM | POA: Diagnosis not present

## 2020-07-23 DIAGNOSIS — Z0000 Encounter for general adult medical examination without abnormal findings: Secondary | ICD-10-CM | POA: Diagnosis not present

## 2020-07-23 DIAGNOSIS — R69 Illness, unspecified: Secondary | ICD-10-CM | POA: Diagnosis not present

## 2020-07-23 DIAGNOSIS — E7800 Pure hypercholesterolemia, unspecified: Secondary | ICD-10-CM | POA: Diagnosis not present

## 2020-07-23 DIAGNOSIS — I10 Essential (primary) hypertension: Secondary | ICD-10-CM | POA: Diagnosis not present

## 2020-07-23 DIAGNOSIS — Z131 Encounter for screening for diabetes mellitus: Secondary | ICD-10-CM | POA: Diagnosis not present

## 2020-07-23 DIAGNOSIS — Z125 Encounter for screening for malignant neoplasm of prostate: Secondary | ICD-10-CM | POA: Diagnosis not present

## 2020-07-23 DIAGNOSIS — R809 Proteinuria, unspecified: Secondary | ICD-10-CM | POA: Diagnosis not present

## 2020-07-23 DIAGNOSIS — Z1159 Encounter for screening for other viral diseases: Secondary | ICD-10-CM | POA: Diagnosis not present

## 2020-08-01 DIAGNOSIS — R7303 Prediabetes: Secondary | ICD-10-CM | POA: Diagnosis not present

## 2020-08-01 DIAGNOSIS — G4733 Obstructive sleep apnea (adult) (pediatric): Secondary | ICD-10-CM | POA: Diagnosis not present

## 2020-08-01 DIAGNOSIS — R809 Proteinuria, unspecified: Secondary | ICD-10-CM | POA: Diagnosis not present

## 2020-08-01 DIAGNOSIS — I10 Essential (primary) hypertension: Secondary | ICD-10-CM | POA: Diagnosis not present

## 2020-08-07 DIAGNOSIS — R809 Proteinuria, unspecified: Secondary | ICD-10-CM | POA: Diagnosis not present

## 2020-08-07 DIAGNOSIS — I10 Essential (primary) hypertension: Secondary | ICD-10-CM | POA: Diagnosis not present

## 2020-08-07 DIAGNOSIS — E790 Hyperuricemia without signs of inflammatory arthritis and tophaceous disease: Secondary | ICD-10-CM | POA: Diagnosis not present

## 2020-08-07 DIAGNOSIS — R69 Illness, unspecified: Secondary | ICD-10-CM | POA: Diagnosis not present

## 2020-08-17 DIAGNOSIS — G4733 Obstructive sleep apnea (adult) (pediatric): Secondary | ICD-10-CM | POA: Diagnosis not present

## 2020-08-17 DIAGNOSIS — R69 Illness, unspecified: Secondary | ICD-10-CM | POA: Diagnosis not present

## 2020-08-17 DIAGNOSIS — R7303 Prediabetes: Secondary | ICD-10-CM | POA: Diagnosis not present

## 2020-08-17 DIAGNOSIS — I10 Essential (primary) hypertension: Secondary | ICD-10-CM | POA: Diagnosis not present

## 2020-09-10 DIAGNOSIS — M109 Gout, unspecified: Secondary | ICD-10-CM | POA: Diagnosis not present

## 2020-09-10 DIAGNOSIS — I129 Hypertensive chronic kidney disease with stage 1 through stage 4 chronic kidney disease, or unspecified chronic kidney disease: Secondary | ICD-10-CM | POA: Diagnosis not present

## 2020-09-10 DIAGNOSIS — R809 Proteinuria, unspecified: Secondary | ICD-10-CM | POA: Diagnosis not present

## 2020-09-10 DIAGNOSIS — N182 Chronic kidney disease, stage 2 (mild): Secondary | ICD-10-CM | POA: Diagnosis not present

## 2020-09-22 DIAGNOSIS — Z639 Problem related to primary support group, unspecified: Secondary | ICD-10-CM | POA: Diagnosis not present

## 2020-09-22 DIAGNOSIS — R69 Illness, unspecified: Secondary | ICD-10-CM | POA: Diagnosis not present

## 2020-09-22 DIAGNOSIS — F411 Generalized anxiety disorder: Secondary | ICD-10-CM | POA: Diagnosis not present

## 2020-09-22 DIAGNOSIS — Z79899 Other long term (current) drug therapy: Secondary | ICD-10-CM | POA: Diagnosis not present

## 2020-11-14 DIAGNOSIS — F900 Attention-deficit hyperactivity disorder, predominantly inattentive type: Secondary | ICD-10-CM | POA: Diagnosis not present

## 2020-11-14 DIAGNOSIS — F411 Generalized anxiety disorder: Secondary | ICD-10-CM | POA: Diagnosis not present

## 2020-11-14 DIAGNOSIS — R69 Illness, unspecified: Secondary | ICD-10-CM | POA: Diagnosis not present

## 2020-11-14 DIAGNOSIS — Z639 Problem related to primary support group, unspecified: Secondary | ICD-10-CM | POA: Diagnosis not present

## 2020-11-23 DIAGNOSIS — R1013 Epigastric pain: Secondary | ICD-10-CM | POA: Diagnosis not present

## 2020-11-23 DIAGNOSIS — R809 Proteinuria, unspecified: Secondary | ICD-10-CM | POA: Diagnosis not present

## 2020-11-23 DIAGNOSIS — R609 Edema, unspecified: Secondary | ICD-10-CM | POA: Diagnosis not present

## 2020-11-23 DIAGNOSIS — I10 Essential (primary) hypertension: Secondary | ICD-10-CM | POA: Diagnosis not present

## 2020-11-30 DIAGNOSIS — R809 Proteinuria, unspecified: Secondary | ICD-10-CM | POA: Diagnosis not present

## 2020-11-30 DIAGNOSIS — R7303 Prediabetes: Secondary | ICD-10-CM | POA: Diagnosis not present

## 2020-12-07 DIAGNOSIS — R809 Proteinuria, unspecified: Secondary | ICD-10-CM | POA: Diagnosis not present

## 2020-12-10 DIAGNOSIS — I10 Essential (primary) hypertension: Secondary | ICD-10-CM | POA: Diagnosis not present

## 2020-12-10 DIAGNOSIS — R609 Edema, unspecified: Secondary | ICD-10-CM | POA: Diagnosis not present

## 2020-12-10 DIAGNOSIS — R809 Proteinuria, unspecified: Secondary | ICD-10-CM | POA: Diagnosis not present

## 2020-12-10 DIAGNOSIS — K047 Periapical abscess without sinus: Secondary | ICD-10-CM | POA: Diagnosis not present

## 2020-12-15 DIAGNOSIS — F900 Attention-deficit hyperactivity disorder, predominantly inattentive type: Secondary | ICD-10-CM | POA: Diagnosis not present

## 2020-12-15 DIAGNOSIS — R69 Illness, unspecified: Secondary | ICD-10-CM | POA: Diagnosis not present

## 2020-12-15 DIAGNOSIS — F411 Generalized anxiety disorder: Secondary | ICD-10-CM | POA: Diagnosis not present

## 2020-12-15 DIAGNOSIS — Z79899 Other long term (current) drug therapy: Secondary | ICD-10-CM | POA: Diagnosis not present

## 2020-12-17 DIAGNOSIS — M109 Gout, unspecified: Secondary | ICD-10-CM | POA: Diagnosis not present

## 2020-12-17 DIAGNOSIS — I129 Hypertensive chronic kidney disease with stage 1 through stage 4 chronic kidney disease, or unspecified chronic kidney disease: Secondary | ICD-10-CM | POA: Diagnosis not present

## 2020-12-17 DIAGNOSIS — R809 Proteinuria, unspecified: Secondary | ICD-10-CM | POA: Diagnosis not present

## 2020-12-17 DIAGNOSIS — N182 Chronic kidney disease, stage 2 (mild): Secondary | ICD-10-CM | POA: Diagnosis not present

## 2020-12-20 DIAGNOSIS — N5201 Erectile dysfunction due to arterial insufficiency: Secondary | ICD-10-CM | POA: Diagnosis not present

## 2020-12-20 DIAGNOSIS — R3912 Poor urinary stream: Secondary | ICD-10-CM | POA: Diagnosis not present

## 2020-12-20 DIAGNOSIS — N401 Enlarged prostate with lower urinary tract symptoms: Secondary | ICD-10-CM | POA: Diagnosis not present

## 2020-12-20 DIAGNOSIS — R69 Illness, unspecified: Secondary | ICD-10-CM | POA: Diagnosis not present

## 2020-12-20 DIAGNOSIS — Z125 Encounter for screening for malignant neoplasm of prostate: Secondary | ICD-10-CM | POA: Diagnosis not present

## 2021-01-09 DIAGNOSIS — F900 Attention-deficit hyperactivity disorder, predominantly inattentive type: Secondary | ICD-10-CM | POA: Diagnosis not present

## 2021-01-09 DIAGNOSIS — F330 Major depressive disorder, recurrent, mild: Secondary | ICD-10-CM | POA: Diagnosis not present

## 2021-01-09 DIAGNOSIS — Z79899 Other long term (current) drug therapy: Secondary | ICD-10-CM | POA: Diagnosis not present

## 2021-01-09 DIAGNOSIS — R69 Illness, unspecified: Secondary | ICD-10-CM | POA: Diagnosis not present

## 2021-01-09 DIAGNOSIS — F411 Generalized anxiety disorder: Secondary | ICD-10-CM | POA: Diagnosis not present

## 2021-02-16 DIAGNOSIS — I10 Essential (primary) hypertension: Secondary | ICD-10-CM | POA: Diagnosis not present

## 2021-02-16 DIAGNOSIS — Z0000 Encounter for general adult medical examination without abnormal findings: Secondary | ICD-10-CM | POA: Diagnosis not present

## 2021-02-16 DIAGNOSIS — Z6841 Body Mass Index (BMI) 40.0 and over, adult: Secondary | ICD-10-CM | POA: Diagnosis not present

## 2021-02-16 DIAGNOSIS — Z1331 Encounter for screening for depression: Secondary | ICD-10-CM | POA: Diagnosis not present

## 2021-02-16 DIAGNOSIS — R9431 Abnormal electrocardiogram [ECG] [EKG]: Secondary | ICD-10-CM | POA: Diagnosis not present

## 2021-02-16 DIAGNOSIS — K219 Gastro-esophageal reflux disease without esophagitis: Secondary | ICD-10-CM | POA: Diagnosis not present

## 2021-02-16 DIAGNOSIS — R8290 Unspecified abnormal findings in urine: Secondary | ICD-10-CM | POA: Diagnosis not present

## 2021-02-21 DIAGNOSIS — I10 Essential (primary) hypertension: Secondary | ICD-10-CM | POA: Diagnosis not present

## 2021-02-21 DIAGNOSIS — K219 Gastro-esophageal reflux disease without esophagitis: Secondary | ICD-10-CM | POA: Diagnosis not present

## 2021-02-21 DIAGNOSIS — R7303 Prediabetes: Secondary | ICD-10-CM | POA: Diagnosis not present

## 2021-02-21 DIAGNOSIS — R809 Proteinuria, unspecified: Secondary | ICD-10-CM | POA: Diagnosis not present

## 2021-03-06 DIAGNOSIS — F330 Major depressive disorder, recurrent, mild: Secondary | ICD-10-CM | POA: Diagnosis not present

## 2021-03-06 DIAGNOSIS — R69 Illness, unspecified: Secondary | ICD-10-CM | POA: Diagnosis not present

## 2021-03-06 DIAGNOSIS — F411 Generalized anxiety disorder: Secondary | ICD-10-CM | POA: Diagnosis not present

## 2021-03-06 DIAGNOSIS — Z79899 Other long term (current) drug therapy: Secondary | ICD-10-CM | POA: Diagnosis not present

## 2021-03-06 DIAGNOSIS — F900 Attention-deficit hyperactivity disorder, predominantly inattentive type: Secondary | ICD-10-CM | POA: Diagnosis not present

## 2021-03-27 DIAGNOSIS — U071 COVID-19: Secondary | ICD-10-CM | POA: Diagnosis not present

## 2021-03-28 ENCOUNTER — Emergency Department (HOSPITAL_COMMUNITY): Payer: Medicare HMO

## 2021-03-28 ENCOUNTER — Emergency Department (HOSPITAL_COMMUNITY)
Admission: EM | Admit: 2021-03-28 | Discharge: 2021-03-28 | Disposition: A | Discharge status: Home / Self Care | Payer: Medicare HMO | Attending: Emergency Medicine | Admitting: Emergency Medicine

## 2021-03-28 ENCOUNTER — Encounter (HOSPITAL_COMMUNITY): Payer: Self-pay | Admitting: *Deleted

## 2021-03-28 DIAGNOSIS — U071 COVID-19: Secondary | ICD-10-CM | POA: Diagnosis not present

## 2021-03-28 DIAGNOSIS — E119 Type 2 diabetes mellitus without complications: Secondary | ICD-10-CM | POA: Diagnosis not present

## 2021-03-28 DIAGNOSIS — I517 Cardiomegaly: Secondary | ICD-10-CM | POA: Diagnosis not present

## 2021-03-28 DIAGNOSIS — Z79899 Other long term (current) drug therapy: Secondary | ICD-10-CM | POA: Insufficient documentation

## 2021-03-28 DIAGNOSIS — Z9884 Bariatric surgery status: Secondary | ICD-10-CM | POA: Diagnosis not present

## 2021-03-28 DIAGNOSIS — I10 Essential (primary) hypertension: Secondary | ICD-10-CM | POA: Diagnosis not present

## 2021-03-28 LAB — BASIC METABOLIC PANEL
Anion gap: 9 (ref 5–15)
BUN: 9 mg/dL (ref 6–20)
CO2: 25 mmol/L (ref 22–32)
Calcium: 8.7 mg/dL — ABNORMAL LOW (ref 8.9–10.3)
Chloride: 101 mmol/L (ref 98–111)
Creatinine, Ser: 1.06 mg/dL (ref 0.61–1.24)
GFR, Estimated: >60 mL/min (ref >60)
Glucose, Bld: 120 mg/dL — ABNORMAL HIGH (ref 70–99)
Potassium: 3.6 mmol/L (ref 3.5–5.1)
Sodium: 135 mmol/L (ref 135–145)

## 2021-03-28 LAB — CBC WITH DIFFERENTIAL/PLATELET
Abs Immature Granulocytes: 0.01 10*3/uL (ref 0.00–0.07)
Basophils Absolute: 0 10*3/uL (ref 0.0–0.1)
Basophils Relative: 1 %
Eosinophils Absolute: 0.2 10*3/uL (ref 0.0–0.5)
Eosinophils Relative: 4 %
HCT: 45.4 % (ref 39.0–52.0)
Hemoglobin: 14.8 g/dL (ref 13.0–17.0)
Immature Granulocytes: 0 %
Lymphocytes Relative: 30 %
Lymphs Abs: 1.2 10*3/uL (ref 0.7–4.0)
MCH: 26.6 pg (ref 26.0–34.0)
MCHC: 32.6 g/dL (ref 30.0–36.0)
MCV: 81.5 fL (ref 80.0–100.0)
Monocytes Absolute: 0.7 10*3/uL (ref 0.1–1.0)
Monocytes Relative: 16 %
Neutro Abs: 2 10*3/uL (ref 1.7–7.7)
Neutrophils Relative %: 49 %
Platelets: 252 10*3/uL (ref 150–400)
RBC: 5.57 MIL/uL (ref 4.22–5.81)
RDW: 17.4 % — ABNORMAL HIGH (ref 11.5–15.5)
WBC: 4 10*3/uL (ref 4.0–10.5)
nRBC: 0 % (ref 0.0–0.2)

## 2021-03-28 MED ORDER — FLUTICASONE PROPIONATE 50 MCG/ACT NA SUSP: 1.0000 | Freq: Every day | NASAL | 0 refills | Status: DC

## 2021-03-28 MED ORDER — ALBUTEROL SULFATE HFA 108 (90 BASE) MCG/ACT IN AERS: 1.0000 | INHALATION_SPRAY | Freq: Four times a day (QID) | RESPIRATORY_TRACT | 0 refills | Status: DC | PRN

## 2021-03-28 MED ORDER — BENZONATATE 100 MG PO CAPS: 100.0000 mg | ORAL_CAPSULE | Freq: Three times a day (TID) | ORAL | 0 refills | Status: DC

## 2021-03-28 MED ORDER — SODIUM CHLORIDE 0.9 % IV BOLUS
500.0000 mL | Freq: Once | INTRAVENOUS | Status: AC
  Administered 2021-03-28: 500 mL via INTRAVENOUS

## 2021-03-28 NOTE — ED Provider Notes (Signed)
[facility] EMERGENCY DEPARTMENT Provider Note   CSN: [id number] Arrival date & time: 03/28/21  1057     History Chief Complaint  Patient presents with   Covid Positive    Julian Richardson is a 50 y.o. male with no significant past medical history presents to the ED today complaining of positive COVID test yesterday.  He tested positive for COVID on 03/27/2021 and was started on Nirmatrelvir.  He has associated nasal congestion, sneezing, cough.  He denies chest pain, shortness of breath, abdominal pain, fever, chills, nausea, vomiting.   The history is provided by the patient. No language interpreter was used.      Past Medical History:  Diagnosis Date   ADHD (attention deficit hyperactivity disorder)    Diabetes mellitus without complication (HCC)    Gout    Hypertension     There are no problems to display for this patient.   Past Surgical History:  Procedure Laterality Date   GASTRIC BYPASS         Family History  Problem Relation Age of Onset   Diabetes Mother     Social History   Tobacco Use   Smoking status: Never   Smokeless tobacco: Never  Substance Use Topics   Alcohol use: No   Drug use: No    Home Medications Prior to Admission medications   Medication Sig Start Date End Date Taking? Authorizing Provider  acetaminophen (TYLENOL) 500 MG tablet Take 1,000 mg by mouth every 6 (six) hours as needed for mild pain.   Yes [provider]  albuterol (VENTOLIN HFA) 108 (90 Base) MCG/ACT inhaler Inhale 1-2 puffs into the lungs every 6 (six) hours as needed for wheezing or shortness of breath. 03/28/21  Yes Kenzley Ke A, PA-C  allopurinol (ZYLOPRIM) 100 MG tablet Take 100 mg by mouth daily. 03/06/21  Yes [provider]  amLODipine (NORVASC) 5 MG tablet TAKE ONE TABLET BY MOUTH TWICE DAILY. 01/28/17  Yes Pricilla Riffle, MD  amphetamine-dextroamphetamine (ADDERALL XR) 30 MG 24 hr capsule Take 30 mg by mouth daily. 03/06/21  Yes [provider]  benzonatate (TESSALON) 100 MG capsule Take 1 capsule (100 mg total) by mouth every 8 (eight) hours. 03/28/21  Yes Juanluis Guastella A, PA-C  fluticasone (FLONASE) 50 MCG/ACT nasal spray Place 1 spray into both nostrils daily for 7 days. 03/28/21 04/04/21 Yes Talmage Teaster A, PA-C  lisinopril (ZESTRIL) 40 MG tablet Take 40 mg by mouth daily. 02/12/21  Yes [provider]  metoprolol tartrate (LOPRESSOR) 25 MG tablet Take 25 mg by mouth 2 (two) times daily. 02/22/21  Yes [provider]  omeprazole (PRILOSEC) 20 MG capsule Take 20 mg by mouth 2 (two) times daily before a meal.   Yes [provider]  tamsulosin (FLOMAX) 0.4 MG CAPS capsule Take 0.4 mg by mouth daily. 03/10/21  Yes [provider]  clonazePAM (KLONOPIN) 0.5 MG tablet Take 0.5 mg by mouth 2 (two) times daily as needed for anxiety. Patient not taking: Reported on 03/28/2021    [provider]  cloNIDine (CATAPRES) 0.2 MG tablet TAKE 1 TABLET BY MOUTH TWICE DAILY. Patient not taking: Reported on 03/28/2021 09/16/16   Pricilla Riffle, MD  febuxostat (ULORIC) 40 MG tablet Take 40 mg by mouth daily. Patient not taking: Reported on 03/28/2021    [provider]  gabapentin (NEURONTIN) 800 MG tablet Take 400-1,600 mg by mouth 2 (two) times daily. 400 mg in the afternoon and 1600 mg at bedtime  Patient not taking: Reported on 03/28/2021    [provider]  venlafaxine XR (EFFEXOR-XR) 150 MG 24 hr capsule Take one capsule twice daily. Patient not taking: Reported on 03/28/2021 04/03/15   [provider]  Chlorpheniramine Maleate (ALLERGY PO) Take 1 tablet by mouth daily as needed (allergies).  01/16/14  [provider]    Allergies    Ibuprofen, Morphine and related, Nsaids, Tolmetin, Aspirin, and Reglan [metoclopramide]  Review of Systems   Review of Systems  Constitutional:  Negative for chills and fever.  HENT:  Positive for congestion and sneezing.  Negative for rhinorrhea, sore throat and trouble swallowing.   Respiratory:  Positive for cough. Negative for shortness of breath.   Cardiovascular:  Negative for chest pain.  Gastrointestinal:  Negative for abdominal pain, nausea and vomiting.  Skin:  Negative for rash.  All other systems reviewed and are negative.  Physical Exam Updated Vital Signs BP (!) 156/80   Pulse (!) 103   Temp (!) 97.4 F (36.3 C) (Oral)   Resp 18   Wt (!) 154.3 kg   SpO2 100%   BMI 47.43 kg/m   Physical Exam Vitals and nursing note reviewed.  Constitutional:      General: He is not in acute distress.    Appearance: He is not diaphoretic.  HENT:     Head: Normocephalic and atraumatic.     Mouth/Throat:     Pharynx: No oropharyngeal exudate.  Eyes:     General: No scleral icterus.    Conjunctiva/sclera: Conjunctivae normal.  Cardiovascular:     Rate and Rhythm: Normal rate and regular rhythm.     Pulses: Normal pulses.     Heart sounds: Normal heart sounds.  Pulmonary:     Effort: Pulmonary effort is normal. No respiratory distress.     Breath sounds: Normal breath sounds. No wheezing.  Abdominal:     General: Bowel sounds are normal.     Palpations: Abdomen is soft. There is no mass.     Tenderness: There is no abdominal tenderness. There is no guarding or rebound.  Musculoskeletal:        General: Normal range of motion.     Cervical back: Normal range of motion and neck supple.     Comments: Moves all extremities x4.  Skin:    General: Skin is warm and dry.  Neurological:     Mental Status: He is alert.  Psychiatric:        Behavior: Behavior normal.    ED Results / Procedures / Treatments   Labs (all labs ordered are listed, but only abnormal results are displayed) Labs Reviewed  BASIC METABOLIC PANEL - Abnormal; Notable for the following components:      Result Value   Glucose, Bld 120 (*)    Calcium 8.7 (*)    All other components within normal limits  CBC WITH  DIFFERENTIAL/PLATELET - Abnormal; Notable for the following components:   RDW 17.4 (*)    All other components within normal limits    EKG None  Radiology DG Chest Portable 1 View  Result Date: 03/28/2021 CLINICAL DATA:  Positive COVID test EXAM: PORTABLE CHEST 1 VIEW COMPARISON:  Chest radiograph 08/16/2014 FINDINGS: The heart is enlarged. The upper mediastinal contours are within normal limits. There is no focal consolidation or pulmonary edema. There is no pleural effusion or pneumothorax. There is no acute osseous abnormality. IMPRESSION: Cardiomegaly. Otherwise, no radiographic evidence of acute cardiopulmonary process. Electronically Signed   By:  Lesia Hausen M.D.   On: 03/28/2021 12:56    Procedures Procedures   Medications Ordered in ED Medications  sodium chloride 0.9 % bolus 500 mL (0 mLs Intravenous Stopped 03/28/21 1342)    ED Course  I have reviewed the triage vital signs and the nursing notes.  Pertinent labs & imaging results that were available during my care of the patient were reviewed by me and considered in my medical decision making (see chart for details).  Clinical Course as of 03/28/21 1507  Thu Mar 28, 2021  1453 Patient reevaluated prior to discharge.  Discussed discharge treatment plan with patient.  Patient agreeable.  Patient appears safe for discharge. [SB]    Clinical Course User Index [SB] Marsheila Alejo A, PA-C   MDM Rules/Calculators/A&P                         Patient presents to the ED with symptoms following positive COVID test on yesterday.  Patient was started on Nirmatrelvir.  He tested positive at [facility].  Vital signs stable.  On exam patient without acute cardiovascular, respiratory, abdominal exam findings.  Chest x-ray without acute cardiopulmonary process.  CBC and BMP unremarkable.  Patient given 500 mL IV fluid bolus.  Patient tolerating PO intake.  Discussed and stressed importance of continuing with Nirmatrlvir as  previously prescribed.  Will prescribe patient albuterol inhaler, Flonase, and Tessalon Perles.  Discussed with patient can use over-the-counter Tylenol or ibuprofen as needed for myalgias.  Advised patient to follow-up with primary care provider if continuing symptoms.  Strict return precautions discussed with patient at bedside.  Patient acknowledges and verbalizes understanding.  Patient for safe discharge at this time.  Follow-up as indicated in discharge paperwork.  Final Clinical Impression(s) / ED Diagnoses Final diagnoses:  COVID-19    Rx / DC Orders ED Discharge Orders          Ordered    albuterol (VENTOLIN HFA) 108 (90 Base) MCG/ACT inhaler  Every 6 hours PRN        03/28/21 1457    fluticasone (FLONASE) 50 MCG/ACT nasal spray  Daily        03/28/21 1457    benzonatate (TESSALON) 100 MG capsule  Every 8 hours        03/28/21 1457             Albany Winslow A, PA-C 03/28/21 1507    Maia Plan, MD 04/01/21 1034

## 2021-03-28 NOTE — Discharge Instructions (Addendum)
Continue taking the Nirmatrelvir as prescribed.  You are sent a prescription for albuterol inhaler, Tessalon Perles, and Flonase.  Take as prescribed.  You may take over-the-counter 600 mg ibuprofen every 6 hours or 1000 mg Tylenol every 6 hours as needed for body aches.  You may follow-up with your primary care provider.  Return to the emergency department if you are experiencing increasing/worsening chest pain, shortness of breath, or worsening symptoms.

## 2021-03-28 NOTE — ED Triage Notes (Signed)
COVID POSITIVE AND STARTED ON MEDICATION YESTERDAY, STATES HE DOES NOT THINK THE MEDICATION IS AGREEING WITH HIM

## 2021-04-03 DIAGNOSIS — G4733 Obstructive sleep apnea (adult) (pediatric): Secondary | ICD-10-CM | POA: Diagnosis not present

## 2021-04-03 DIAGNOSIS — R7303 Prediabetes: Secondary | ICD-10-CM | POA: Diagnosis not present

## 2021-04-03 DIAGNOSIS — R030 Elevated blood-pressure reading, without diagnosis of hypertension: Secondary | ICD-10-CM | POA: Diagnosis not present

## 2021-04-03 DIAGNOSIS — U099 Post covid-19 condition, unspecified: Secondary | ICD-10-CM | POA: Diagnosis not present

## 2021-04-03 DIAGNOSIS — R809 Proteinuria, unspecified: Secondary | ICD-10-CM | POA: Diagnosis not present

## 2021-04-03 DIAGNOSIS — Z6841 Body Mass Index (BMI) 40.0 and over, adult: Secondary | ICD-10-CM | POA: Diagnosis not present

## 2021-04-27 DIAGNOSIS — N400 Enlarged prostate without lower urinary tract symptoms: Secondary | ICD-10-CM | POA: Diagnosis not present

## 2021-04-27 DIAGNOSIS — Z6841 Body Mass Index (BMI) 40.0 and over, adult: Secondary | ICD-10-CM | POA: Diagnosis not present

## 2021-04-27 DIAGNOSIS — F439 Reaction to severe stress, unspecified: Secondary | ICD-10-CM | POA: Diagnosis not present

## 2021-04-27 DIAGNOSIS — Z008 Encounter for other general examination: Secondary | ICD-10-CM | POA: Diagnosis not present

## 2021-04-27 DIAGNOSIS — F900 Attention-deficit hyperactivity disorder, predominantly inattentive type: Secondary | ICD-10-CM | POA: Diagnosis not present

## 2021-04-27 DIAGNOSIS — M109 Gout, unspecified: Secondary | ICD-10-CM | POA: Diagnosis not present

## 2021-04-27 DIAGNOSIS — I10 Essential (primary) hypertension: Secondary | ICD-10-CM | POA: Diagnosis not present

## 2021-04-27 DIAGNOSIS — K219 Gastro-esophageal reflux disease without esophagitis: Secondary | ICD-10-CM | POA: Diagnosis not present

## 2021-04-27 DIAGNOSIS — F411 Generalized anxiety disorder: Secondary | ICD-10-CM | POA: Diagnosis not present

## 2021-04-27 DIAGNOSIS — F331 Major depressive disorder, recurrent, moderate: Secondary | ICD-10-CM | POA: Diagnosis not present

## 2021-04-27 DIAGNOSIS — E119 Type 2 diabetes mellitus without complications: Secondary | ICD-10-CM | POA: Diagnosis not present

## 2021-04-27 DIAGNOSIS — R69 Illness, unspecified: Secondary | ICD-10-CM | POA: Diagnosis not present

## 2021-04-27 DIAGNOSIS — G47419 Narcolepsy without cataplexy: Secondary | ICD-10-CM | POA: Diagnosis not present

## 2021-05-02 DIAGNOSIS — F330 Major depressive disorder, recurrent, mild: Secondary | ICD-10-CM | POA: Diagnosis not present

## 2021-05-02 DIAGNOSIS — F411 Generalized anxiety disorder: Secondary | ICD-10-CM | POA: Diagnosis not present

## 2021-05-02 DIAGNOSIS — Z79899 Other long term (current) drug therapy: Secondary | ICD-10-CM | POA: Diagnosis not present

## 2021-05-02 DIAGNOSIS — R69 Illness, unspecified: Secondary | ICD-10-CM | POA: Diagnosis not present

## 2021-05-02 DIAGNOSIS — U099 Post covid-19 condition, unspecified: Secondary | ICD-10-CM | POA: Diagnosis not present

## 2021-05-02 DIAGNOSIS — F900 Attention-deficit hyperactivity disorder, predominantly inattentive type: Secondary | ICD-10-CM | POA: Diagnosis not present

## 2021-05-30 DIAGNOSIS — Z79899 Other long term (current) drug therapy: Secondary | ICD-10-CM | POA: Diagnosis not present

## 2021-05-30 DIAGNOSIS — R69 Illness, unspecified: Secondary | ICD-10-CM | POA: Diagnosis not present

## 2021-05-30 DIAGNOSIS — F411 Generalized anxiety disorder: Secondary | ICD-10-CM | POA: Diagnosis not present

## 2021-05-30 DIAGNOSIS — F900 Attention-deficit hyperactivity disorder, predominantly inattentive type: Secondary | ICD-10-CM | POA: Diagnosis not present

## 2021-05-30 DIAGNOSIS — F330 Major depressive disorder, recurrent, mild: Secondary | ICD-10-CM | POA: Diagnosis not present

## 2021-05-30 DIAGNOSIS — U099 Post covid-19 condition, unspecified: Secondary | ICD-10-CM | POA: Diagnosis not present

## 2021-06-07 DIAGNOSIS — Z6841 Body Mass Index (BMI) 40.0 and over, adult: Secondary | ICD-10-CM | POA: Diagnosis not present

## 2021-06-07 DIAGNOSIS — Z0000 Encounter for general adult medical examination without abnormal findings: Secondary | ICD-10-CM | POA: Diagnosis not present

## 2021-06-07 DIAGNOSIS — I10 Essential (primary) hypertension: Secondary | ICD-10-CM | POA: Diagnosis not present

## 2021-06-07 DIAGNOSIS — Z760 Encounter for issue of repeat prescription: Secondary | ICD-10-CM | POA: Diagnosis not present

## 2021-06-07 DIAGNOSIS — R7303 Prediabetes: Secondary | ICD-10-CM | POA: Diagnosis not present

## 2021-06-07 DIAGNOSIS — Z789 Other specified health status: Secondary | ICD-10-CM | POA: Diagnosis not present

## 2021-06-27 DIAGNOSIS — Z79899 Other long term (current) drug therapy: Secondary | ICD-10-CM | POA: Diagnosis not present

## 2021-06-27 DIAGNOSIS — F411 Generalized anxiety disorder: Secondary | ICD-10-CM | POA: Diagnosis not present

## 2021-06-27 DIAGNOSIS — F900 Attention-deficit hyperactivity disorder, predominantly inattentive type: Secondary | ICD-10-CM | POA: Diagnosis not present

## 2021-06-27 DIAGNOSIS — Z6841 Body Mass Index (BMI) 40.0 and over, adult: Secondary | ICD-10-CM | POA: Diagnosis not present

## 2021-06-27 DIAGNOSIS — R69 Illness, unspecified: Secondary | ICD-10-CM | POA: Diagnosis not present

## 2021-07-03 DIAGNOSIS — Z79899 Other long term (current) drug therapy: Secondary | ICD-10-CM | POA: Diagnosis not present

## 2021-07-15 DIAGNOSIS — N182 Chronic kidney disease, stage 2 (mild): Secondary | ICD-10-CM | POA: Diagnosis not present

## 2021-07-15 DIAGNOSIS — R809 Proteinuria, unspecified: Secondary | ICD-10-CM | POA: Diagnosis not present

## 2021-07-15 DIAGNOSIS — I129 Hypertensive chronic kidney disease with stage 1 through stage 4 chronic kidney disease, or unspecified chronic kidney disease: Secondary | ICD-10-CM | POA: Diagnosis not present

## 2021-07-15 DIAGNOSIS — M109 Gout, unspecified: Secondary | ICD-10-CM | POA: Diagnosis not present

## 2021-07-16 DIAGNOSIS — R809 Proteinuria, unspecified: Secondary | ICD-10-CM | POA: Diagnosis not present

## 2021-07-16 DIAGNOSIS — N182 Chronic kidney disease, stage 2 (mild): Secondary | ICD-10-CM | POA: Diagnosis not present

## 2021-07-23 DIAGNOSIS — E119 Type 2 diabetes mellitus without complications: Secondary | ICD-10-CM | POA: Diagnosis not present

## 2021-07-23 DIAGNOSIS — R69 Illness, unspecified: Secondary | ICD-10-CM | POA: Diagnosis not present

## 2021-07-23 DIAGNOSIS — I10 Essential (primary) hypertension: Secondary | ICD-10-CM | POA: Diagnosis not present

## 2021-07-24 DIAGNOSIS — E119 Type 2 diabetes mellitus without complications: Secondary | ICD-10-CM | POA: Diagnosis not present

## 2021-07-24 DIAGNOSIS — E039 Hypothyroidism, unspecified: Secondary | ICD-10-CM | POA: Diagnosis not present

## 2021-07-24 DIAGNOSIS — I10 Essential (primary) hypertension: Secondary | ICD-10-CM | POA: Diagnosis not present

## 2021-07-25 LAB — COMPREHENSIVE METABOLIC PANEL
Albumin: 3.9 (ref 3.5–5.0)
Calcium: 9.2 (ref 8.7–10.7)
Globulin: 3

## 2021-07-25 LAB — LIPID PANEL
Cholesterol: 181 (ref 0–200)
HDL: 49 (ref 35–70)
LDL Cholesterol: 106
Triglycerides: 149 (ref 40–160)

## 2021-07-25 LAB — BASIC METABOLIC PANEL
BUN: 15 (ref 4–21)
CO2: 25 — AB (ref 13–22)
Chloride: 104 (ref 99–108)
Creatinine: 1 (ref 0.6–1.3)
Glucose: 96
Potassium: 4 mEq/L (ref 3.5–5.1)
Sodium: 140 (ref 137–147)

## 2021-07-25 LAB — HEMOGLOBIN A1C: Hemoglobin A1C: 5.6

## 2021-07-25 LAB — HEPATIC FUNCTION PANEL
ALT: 27 U/L (ref 10–40)
AST: 24 (ref 14–40)
Alkaline Phosphatase: 70 (ref 25–125)
Bilirubin, Total: 0.4

## 2021-07-25 LAB — TSH: TSH: 0.96 (ref 0.41–5.90)

## 2021-07-31 DIAGNOSIS — Z79899 Other long term (current) drug therapy: Secondary | ICD-10-CM | POA: Diagnosis not present

## 2021-07-31 DIAGNOSIS — F900 Attention-deficit hyperactivity disorder, predominantly inattentive type: Secondary | ICD-10-CM | POA: Diagnosis not present

## 2021-07-31 DIAGNOSIS — F411 Generalized anxiety disorder: Secondary | ICD-10-CM | POA: Diagnosis not present

## 2021-07-31 DIAGNOSIS — Z6841 Body Mass Index (BMI) 40.0 and over, adult: Secondary | ICD-10-CM | POA: Diagnosis not present

## 2021-07-31 DIAGNOSIS — R69 Illness, unspecified: Secondary | ICD-10-CM | POA: Diagnosis not present

## 2021-08-08 DIAGNOSIS — Z1389 Encounter for screening for other disorder: Secondary | ICD-10-CM | POA: Diagnosis not present

## 2021-08-08 DIAGNOSIS — I10 Essential (primary) hypertension: Secondary | ICD-10-CM | POA: Diagnosis not present

## 2021-08-08 DIAGNOSIS — R69 Illness, unspecified: Secondary | ICD-10-CM | POA: Diagnosis not present

## 2021-08-08 DIAGNOSIS — K219 Gastro-esophageal reflux disease without esophagitis: Secondary | ICD-10-CM | POA: Diagnosis not present

## 2021-08-08 DIAGNOSIS — Z1331 Encounter for screening for depression: Secondary | ICD-10-CM | POA: Diagnosis not present

## 2021-08-08 DIAGNOSIS — Z0001 Encounter for general adult medical examination with abnormal findings: Secondary | ICD-10-CM | POA: Diagnosis not present

## 2021-08-08 DIAGNOSIS — F909 Attention-deficit hyperactivity disorder, unspecified type: Secondary | ICD-10-CM | POA: Diagnosis not present

## 2021-08-29 ENCOUNTER — Encounter: Payer: Self-pay | Admitting: "Endocrinology

## 2021-08-29 ENCOUNTER — Ambulatory Visit (INDEPENDENT_AMBULATORY_CARE_PROVIDER_SITE_OTHER): Payer: Medicare HMO | Admitting: "Endocrinology

## 2021-08-29 DIAGNOSIS — E782 Mixed hyperlipidemia: Secondary | ICD-10-CM

## 2021-08-29 DIAGNOSIS — E119 Type 2 diabetes mellitus without complications: Secondary | ICD-10-CM

## 2021-08-29 DIAGNOSIS — I10 Essential (primary) hypertension: Secondary | ICD-10-CM | POA: Diagnosis not present

## 2021-08-29 DIAGNOSIS — E559 Vitamin D deficiency, unspecified: Secondary | ICD-10-CM | POA: Insufficient documentation

## 2021-08-29 DIAGNOSIS — R7303 Prediabetes: Secondary | ICD-10-CM | POA: Insufficient documentation

## 2021-08-29 NOTE — Patient Instructions (Signed)

## 2021-08-29 NOTE — Progress Notes (Signed)
? ?                                                             Endocrinology Consult Note  ?     08/29/2021, 3:56 PM ? ? ?Subjective:  ? ? Patient ID: Julian Richardson, male    DOB: 1970/08/22.  ?Tracie Suchecki is being seen in consultation for management of obesity requested by Dr. Rosita Fire. ? ? ? ?Past Medical History:  ?Diagnosis Date  ? ADHD (attention deficit hyperactivity disorder)   ? Diabetes mellitus without complication ([place])   ? Gout   ? Hypertension   ? ? ?Past Surgical History:  ?Procedure Laterality Date  ? GASTRIC BYPASS    ? ? ?Social History  ? ?Socioeconomic History  ? Marital status: Single  ?  Spouse name: Not on file  ? Number of children: Not on file  ? Years of education: Not on file  ? Highest education level: Not on file  ?Occupational History  ? Not on file  ?Tobacco Use  ? Smoking status: Never  ? Smokeless tobacco: Never  ?Vaping Use  ? Vaping Use: Never used  ?Substance and Sexual Activity  ? Alcohol use: No  ? Drug use: No  ? Sexual activity: Not on file  ?Other Topics Concern  ? Not on file  ?Social History Narrative  ? Not on file  ? ?Social Determinants of Health  ? ?Financial Resource Strain: Not on file  ?Food Insecurity: Not on file  ?Transportation Needs: Not on file  ?Physical Activity: Not on file  ?Stress: Not on file  ?Social Connections: Not on file  ? ? ?Family History  ?Problem Relation Age of Onset  ? Hypertension Mother   ? Diabetes Mother   ? Hypertension Father   ? Hyperlipidemia Father   ? Heart attack Father   ? ? ?Outpatient Encounter Medications as of 08/29/2021  ?Medication Sig  ? Cholecalciferol (VITAMIN D-3 PO) Take 1 tablet by mouth daily.  ? Coenzyme Q10 (COQ10 PO) Take 1 tablet by mouth daily.  ? Ginkgo Biloba 40 MG TABS Take 1 tablet by mouth daily.  ? Omega-3 Fatty Acids (OMEGA-3 FISH OIL PO) Take 1 capsule by mouth daily.  ? venlafaxine XR (EFFEXOR-XR) 150 MG 24 hr capsule daily with breakfast.  ? acetaminophen (TYLENOL) 500 MG tablet Take  1,000 mg by mouth every 6 (six) hours as needed for mild pain.  ? albuterol (VENTOLIN HFA) 108 (90 Base) MCG/ACT inhaler Inhale 1-2 puffs into the lungs every 6 (six) hours as needed for wheezing or shortness of breath.  ? allopurinol (ZYLOPRIM) 100 MG tablet Take 100 mg by mouth daily.  ? amLODipine (NORVASC) 5 MG tablet TAKE ONE TABLET BY MOUTH TWICE DAILY. (Patient taking differently: daily.)  ? amphetamine-dextroamphetamine (ADDERALL XR) 30 MG 24 hr capsule Take 30 mg by mouth daily.  ? colchicine 0.6 MG tablet Take by mouth as needed.  ? fluticasone (FLONASE) 50 MCG/ACT nasal spray Place 1 spray into both nostrils daily for 7 days.  ? hydrochlorothiazide (HYDRODIURIL) 12.5 MG tablet Take 12.5 mg by mouth daily. (Patient not taking: Reported on 08/29/2021)  ? hydrOXYzine (VISTARIL) 25 MG capsule Take 25 mg by mouth 3 (three) times daily as needed.  ? lisinopril (ZESTRIL) 40 MG tablet Take  40 mg by mouth daily.  ? metoprolol tartrate (LOPRESSOR) 25 MG tablet Take 25 mg by mouth 2 (two) times daily.  ? omeprazole (PRILOSEC) 20 MG capsule Take 20 mg by mouth daily.  ? tamsulosin (FLOMAX) 0.4 MG CAPS capsule Take 0.4 mg by mouth daily.  ? [DISCONTINUED] benzonatate (TESSALON) 100 MG capsule Take 1 capsule (100 mg total) by mouth every 8 (eight) hours.  ? [DISCONTINUED] Chlorpheniramine Maleate (ALLERGY PO) Take 1 tablet by mouth daily as needed (allergies).  ? [DISCONTINUED] clonazePAM (KLONOPIN) 0.5 MG tablet Take 0.5 mg by mouth 2 (two) times daily as needed for anxiety. (Patient not taking: Reported on 03/28/2021)  ? [DISCONTINUED] cloNIDine (CATAPRES) 0.2 MG tablet TAKE 1 TABLET BY MOUTH TWICE DAILY. (Patient not taking: Reported on 03/28/2021)  ? [DISCONTINUED] febuxostat (ULORIC) 40 MG tablet Take 40 mg by mouth daily. (Patient not taking: Reported on 03/28/2021)  ? [DISCONTINUED] gabapentin (NEURONTIN) 800 MG tablet Take 400-1,600 mg by mouth 2 (two) times daily. 400 mg in the afternoon and 1600 mg at bedtime  (Patient not taking: Reported on 03/28/2021)  ? ?No facility-administered encounter medications on file as of 08/29/2021.  ? ? ?ALLERGIES: ?Allergies  ?Allergen Reactions  ? Ibuprofen Other (See Comments)  ?  Stomach upset. ?Stomach upset.  ? Morphine And Related   ?  Eyes burning  ? Nsaids Other (See Comments)  ?  Stomach upset.  ? Tolmetin Other (See Comments)  ?  Stomach upset.  ? Aspirin Nausea Only  ? Reglan [Metoclopramide]   ?  Akathisia type reaction ?  ? ? ?VACCINATION STATUS: ? ?There is no immunization history on file for this patient. ? ?HPI ?History is obtained directly from the patient.  He gives history of being overweight/obese most of his adult life.  He weighed 2 maximum of 360 pounds before he underwent bariatric surgery in 2011.  Following his surgery, subsequently lost about 80 pounds down to 279 pounds.  However, over the next subsequent years progressively gained 68 pounds back to his current level of 347 pounds.  He has history of prediabetes.  He also has hyperlipidemia, hypertension, sleep apnea.  He is dealing with gastroesophageal reflux disease, diffuse osteoarthritis, generalized mood disorder, hypertension, gout. ?He would like to achieve some weight loss. ?He did not make any significant lifestyle changes.  He is on amlodipine 5 mg p.o. daily, pain medications including acetaminophen, Adderall Exar, colchicine, bronchodilators, lisinopril, metoprolol, omega-3 fatty acids, Prilosec, tamsulosin, Effexor, allopurinol, CoQ10. ?He is not particularly active, leads mainly sedentary life. ?He denies any history of thyroid, adrenal, pituitary dysfunctions. ? ?Review of Systems  ?Constitutional:  Negative for chills, fatigue, fever and unexpected weight change.  ?HENT:  Negative for dental problem, mouth sores and trouble swallowing.   ?Eyes:  Negative for visual disturbance.  ?Respiratory:  Negative for cough, choking, chest tightness, shortness of breath and wheezing.   ?Cardiovascular:   Negative for chest pain, palpitations and leg swelling.  ?Gastrointestinal:  Negative for abdominal distention, abdominal pain, constipation, diarrhea, nausea and vomiting.  ?Endocrine: Negative for polydipsia, polyphagia and polyuria.  ?Genitourinary:  Negative for dysuria, flank pain, hematuria and urgency.  ?Musculoskeletal:  Negative for back pain, gait problem, myalgias and neck pain.  ?Skin:  Negative for pallor, rash and wound.  ?Neurological:  Negative for seizures, syncope, weakness, numbness and headaches.  ?Psychiatric/Behavioral: Negative.  Negative for confusion and dysphoric mood.   ? ? ?Objective:  ?  ? ?  08/29/2021  ?  9:47 AM 03/28/2021  ?  1:49 PM 03/28/2021  ?  1:00 PM  ?Vitals with BMI  ?Height 6\' 0"     ?Weight 347 lbs 6 oz    ?BMI 47.11    ?Systolic [id number] [id number] A999333  ?Diastolic 88 80 80  ?Pulse 68 103   ? ? ?BP (!) 142/88   Pulse 68   Ht 6' (1.829 m)   Wt (!) 347 lb 6.4 oz (157.6 kg)   BMI 47.12 kg/m?   ?Wt Readings from Last 3 Encounters:  ?08/29/21 (!) 347 lb 6.4 oz (157.6 kg)  ?03/28/21 (!) 340 lb 1.6 oz (154.3 kg)  ?11/05/15 (!) 362 lb (164.2 kg)  ?  ? ?Physical Exam ?Constitutional:   ?   General: He is not in acute distress. ?   Appearance: He is well-developed.  ?HENT:  ?   Head: Normocephalic and atraumatic.  ?Neck:  ?   Thyroid: No thyromegaly.  ?   Trachea: No tracheal deviation.  ?Cardiovascular:  ?   Rate and Rhythm: Normal rate.  ?   Pulses:     ?     Dorsalis pedis pulses are 1+ on the right side and 1+ on the left side.  ?     Posterior tibial pulses are 1+ on the right side and 1+ on the left side.  ?   Heart sounds: Normal heart sounds, S1 normal and S2 normal. No murmur heard. ?  No gallop.  ?Pulmonary:  ?   Effort: No respiratory distress.  ?   Breath sounds: Normal breath sounds. No wheezing.  ?Abdominal:  ?   General: Bowel sounds are normal. There is no distension.  ?   Palpations: Abdomen is soft.  ?   Tenderness: There is no abdominal tenderness. There is no guarding.  ?    Comments: Obese, nontender.  ?Musculoskeletal:  ?   Right shoulder: No swelling or deformity.  ?   Cervical back: Normal range of motion and neck supple.  ?Skin: ?   General: Skin is warm and dry.  ?   Findings: No rash.

## 2021-08-30 DIAGNOSIS — F411 Generalized anxiety disorder: Secondary | ICD-10-CM | POA: Diagnosis not present

## 2021-08-30 DIAGNOSIS — Z79899 Other long term (current) drug therapy: Secondary | ICD-10-CM | POA: Diagnosis not present

## 2021-08-30 DIAGNOSIS — R69 Illness, unspecified: Secondary | ICD-10-CM | POA: Diagnosis not present

## 2021-08-30 DIAGNOSIS — F330 Major depressive disorder, recurrent, mild: Secondary | ICD-10-CM | POA: Diagnosis not present

## 2021-08-30 DIAGNOSIS — F908 Attention-deficit hyperactivity disorder, other type: Secondary | ICD-10-CM | POA: Diagnosis not present

## 2021-08-30 DIAGNOSIS — Z6841 Body Mass Index (BMI) 40.0 and over, adult: Secondary | ICD-10-CM | POA: Diagnosis not present

## 2021-09-07 DIAGNOSIS — E119 Type 2 diabetes mellitus without complications: Secondary | ICD-10-CM | POA: Diagnosis not present

## 2021-09-07 DIAGNOSIS — I10 Essential (primary) hypertension: Secondary | ICD-10-CM | POA: Diagnosis not present

## 2021-09-25 ENCOUNTER — Ambulatory Visit: Payer: Medicare HMO | Admitting: Nutrition

## 2021-09-27 DIAGNOSIS — R69 Illness, unspecified: Secondary | ICD-10-CM | POA: Diagnosis not present

## 2021-09-27 DIAGNOSIS — Z6841 Body Mass Index (BMI) 40.0 and over, adult: Secondary | ICD-10-CM | POA: Diagnosis not present

## 2021-09-27 DIAGNOSIS — F908 Attention-deficit hyperactivity disorder, other type: Secondary | ICD-10-CM | POA: Diagnosis not present

## 2021-09-27 DIAGNOSIS — F411 Generalized anxiety disorder: Secondary | ICD-10-CM | POA: Diagnosis not present

## 2021-09-27 DIAGNOSIS — F330 Major depressive disorder, recurrent, mild: Secondary | ICD-10-CM | POA: Diagnosis not present

## 2021-10-07 DIAGNOSIS — I129 Hypertensive chronic kidney disease with stage 1 through stage 4 chronic kidney disease, or unspecified chronic kidney disease: Secondary | ICD-10-CM | POA: Diagnosis not present

## 2021-10-07 DIAGNOSIS — N182 Chronic kidney disease, stage 2 (mild): Secondary | ICD-10-CM | POA: Diagnosis not present

## 2021-10-07 DIAGNOSIS — Z6841 Body Mass Index (BMI) 40.0 and over, adult: Secondary | ICD-10-CM | POA: Diagnosis not present

## 2021-10-07 DIAGNOSIS — M109 Gout, unspecified: Secondary | ICD-10-CM | POA: Diagnosis not present

## 2021-10-07 DIAGNOSIS — R809 Proteinuria, unspecified: Secondary | ICD-10-CM | POA: Diagnosis not present

## 2021-10-08 DIAGNOSIS — I10 Essential (primary) hypertension: Secondary | ICD-10-CM | POA: Diagnosis not present

## 2021-10-08 DIAGNOSIS — E119 Type 2 diabetes mellitus without complications: Secondary | ICD-10-CM | POA: Diagnosis not present

## 2021-11-07 DIAGNOSIS — E119 Type 2 diabetes mellitus without complications: Secondary | ICD-10-CM | POA: Diagnosis not present

## 2021-11-07 DIAGNOSIS — R69 Illness, unspecified: Secondary | ICD-10-CM | POA: Diagnosis not present

## 2021-11-18 ENCOUNTER — Ambulatory Visit: Payer: Medicare HMO | Admitting: Nurse Practitioner

## 2021-11-18 ENCOUNTER — Telehealth: Payer: Self-pay | Admitting: Nurse Practitioner

## 2021-11-18 DIAGNOSIS — Z79899 Other long term (current) drug therapy: Secondary | ICD-10-CM | POA: Diagnosis not present

## 2021-11-18 DIAGNOSIS — F909 Attention-deficit hyperactivity disorder, unspecified type: Secondary | ICD-10-CM | POA: Diagnosis not present

## 2021-11-18 DIAGNOSIS — R69 Illness, unspecified: Secondary | ICD-10-CM | POA: Diagnosis not present

## 2021-11-18 DIAGNOSIS — R030 Elevated blood-pressure reading, without diagnosis of hypertension: Secondary | ICD-10-CM | POA: Diagnosis not present

## 2021-11-18 DIAGNOSIS — Z6841 Body Mass Index (BMI) 40.0 and over, adult: Secondary | ICD-10-CM | POA: Diagnosis not present

## 2021-11-18 NOTE — Telephone Encounter (Signed)
7.24.23 no show letter sent 

## 2021-11-20 ENCOUNTER — Ambulatory Visit (HOSPITAL_COMMUNITY): Payer: Medicare HMO | Admitting: Psychiatry

## 2021-11-28 ENCOUNTER — Telehealth: Payer: Self-pay | Admitting: Nurse Practitioner

## 2021-11-28 ENCOUNTER — Ambulatory Visit: Payer: Medicare HMO | Admitting: Nurse Practitioner

## 2021-11-28 NOTE — Progress Notes (Deleted)
New Patient Visit  There were no vitals taken for this visit.   Subjective:    Patient ID: Julian Richardson, male    DOB: [birth year], 51 y.o.   MRN: [id number]  CC: No chief complaint on file.   HPI: Julian Richardson is a 51 y.o. male presents  Past Medical History:  Diagnosis Date   ADHD (attention deficit hyperactivity disorder)    Diabetes mellitus without complication (HCC)    Gout    Hypertension     Past Surgical History:  Procedure Laterality Date   GASTRIC BYPASS      Family History  Problem Relation Age of Onset   Hypertension Mother    Diabetes Mother    Hypertension Father    Hyperlipidemia Father    Heart attack Father      Current Outpatient Medications on File Prior to Visit  Medication Sig Dispense Refill   acetaminophen (TYLENOL) 500 MG tablet Take 1,000 mg by mouth every 6 (six) hours as needed for mild pain.     albuterol (VENTOLIN HFA) 108 (90 Base) MCG/ACT inhaler Inhale 1-2 puffs into the lungs every 6 (six) hours as needed for wheezing or shortness of breath. 6.7 g 0   allopurinol (ZYLOPRIM) 100 MG tablet Take 100 mg by mouth daily.     amLODipine (NORVASC) 5 MG tablet TAKE ONE TABLET BY MOUTH TWICE DAILY. (Patient taking differently: daily.) 60 tablet 0   amphetamine-dextroamphetamine (ADDERALL XR) 30 MG 24 hr capsule Take 30 mg by mouth daily.     Cholecalciferol (VITAMIN D-3 PO) Take 1 tablet by mouth daily.     Coenzyme Q10 (COQ10 PO) Take 1 tablet by mouth daily.     colchicine 0.6 MG tablet Take by mouth as needed.     fluticasone (FLONASE) 50 MCG/ACT nasal spray Place 1 spray into both nostrils daily for 7 days. 1 g 0   Ginkgo Biloba 40 MG TABS Take 1 tablet by mouth daily.     hydrochlorothiazide (HYDRODIURIL) 12.5 MG tablet Take 12.5 mg by mouth daily. (Patient not taking: Reported on 08/29/2021)     hydrOXYzine (VISTARIL) 25 MG capsule Take 25 mg by mouth 3 (three) times daily as needed.     lisinopril (ZESTRIL) 40 MG tablet Take 40 mg by mouth  daily.     metoprolol tartrate (LOPRESSOR) 25 MG tablet Take 25 mg by mouth 2 (two) times daily.     Omega-3 Fatty Acids (OMEGA-3 FISH OIL PO) Take 1 capsule by mouth daily.     omeprazole (PRILOSEC) 20 MG capsule Take 20 mg by mouth daily.     tamsulosin (FLOMAX) 0.4 MG CAPS capsule Take 0.4 mg by mouth daily.     venlafaxine XR (EFFEXOR-XR) 150 MG 24 hr capsule daily with breakfast.     [DISCONTINUED] Chlorpheniramine Maleate (ALLERGY PO) Take 1 tablet by mouth daily as needed (allergies).     No current facility-administered medications on file prior to visit.     Review of Systems      Objective:    There were no vitals taken for this visit.  Wt Readings from Last 3 Encounters:  08/29/21 (!) 347 lb 6.4 oz (157.6 kg)  03/28/21 (!) 340 lb 1.6 oz (154.3 kg)  11/05/15 (!) 362 lb (164.2 kg)    BP Readings from Last 3 Encounters:  08/29/21 (!) 142/88  03/28/21 (!) 156/80  11/05/15 114/70    Physical Exam     Assessment & Plan:   Problem List Items  Addressed This Visit   None    Follow up plan: No follow-ups on file.

## 2021-11-28 NOTE — Telephone Encounter (Signed)
8.3.23 no show letter sent 

## 2021-11-29 ENCOUNTER — Encounter: Payer: Self-pay | Admitting: "Endocrinology

## 2021-11-29 ENCOUNTER — Ambulatory Visit (INDEPENDENT_AMBULATORY_CARE_PROVIDER_SITE_OTHER): Payer: Medicare HMO | Admitting: "Endocrinology

## 2021-11-29 DIAGNOSIS — E559 Vitamin D deficiency, unspecified: Secondary | ICD-10-CM

## 2021-11-29 DIAGNOSIS — E119 Type 2 diabetes mellitus without complications: Secondary | ICD-10-CM

## 2021-11-29 DIAGNOSIS — E782 Mixed hyperlipidemia: Secondary | ICD-10-CM | POA: Diagnosis not present

## 2021-11-29 DIAGNOSIS — I10 Essential (primary) hypertension: Secondary | ICD-10-CM | POA: Diagnosis not present

## 2021-11-29 NOTE — Patient Instructions (Signed)
                                     Advice for Weight Management  -For most of us the best way to lose weight is by diet management. Generally speaking, diet management means consuming less calories intentionally which over time brings about progressive weight loss.  This can be achieved more effectively by avoiding ultra processed carbohydrates, processed meats, unhealthy fats.    It is critically important to know your numbers: how much calorie you are consuming and how much calorie you need. More importantly, our carbohydrates sources should be unprocessed naturally occurring  complex starch food items.  It is always important to balance nutrition also by  appropriate intake of proteins (mainly plant-based), healthy fats/oils, plenty of fruits and vegetables.   -The American College of Lifestyle Medicine (ACL M) recommends nutrition derived mostly from Whole Food, Plant Predominant Sources example an apple instead of applesauce or apple pie. Eat Plenty of vegetables, Mushrooms, fruits, Legumes, Whole Grains, Nuts, seeds in lieu of processed meats, processed snacks/pastries red meat, poultry, eggs.  Use only water or unsweetened tea for hydration.  The College also recommends the need to stay away from risky substances including alcohol, smoking; obtaining 7-9 hours of restorative sleep, at least 150 minutes of moderate intensity exercise weekly, importance of healthy social connections, and being mindful of stress and seek help when it is overwhelming.    -Sticking to a routine mealtime to eat 3 meals a day and avoiding unnecessary snacks is shown to have a big role in weight control. Under normal circumstances, the only time we burn stored energy is when we are hungry, so allow  some hunger to take place- hunger means no food between appropriate meal times, only water.  It is not advisable to starve.   -It is better to avoid simple carbohydrates including:  Cakes, Sweet Desserts, Ice Cream, Soda (diet and regular), Sweet Tea, Candies, Chips, Cookies, Store Bought Juices, Alcohol in Excess of  1-2 drinks a day, Lemonade,  Artificial Sweeteners, Doughnuts, Coffee Creamers, "Sugar-free" Products, etc, etc.  This is not a complete list.....    -Consulting with certified diabetes educators is proven to provide you with the most accurate and current information on diet.  Also, you may be  interested in discussing diet options/exchanges , we can schedule a visit with Julian Richardson, RDN, CDE for individualized nutrition education.  -Exercise: If you are able: 30 -60 minutes a day ,4 days a week, or 150 minutes of moderate intensity exercise weekly.    The longer the better if tolerated.  Combine stretch, strength, and aerobic activities.  If you were told in the past that you have high risk for cardiovascular diseases, or if you are currently symptomatic, you may seek evaluation by your heart doctor prior to initiating moderate to intense exercise programs.                                  Additional Care Considerations for Diabetes/Prediabetes   -Diabetes  is a chronic disease.  The most important care consideration is regular follow-up with your diabetes care provider with the goal being avoiding or delaying its complications and to take advantage of advances in medications and technology.  If appropriate actions are taken early enough, type 2 diabetes can even be   reversed.  Seek information from the right source.  - Whole Food, Plant Predominant Nutrition is highly recommended: Eat Plenty of vegetables, Mushrooms, fruits, Legumes, Whole Grains, Nuts, seeds in lieu of processed meats, processed snacks/pastries red meat, poultry, eggs as recommended by American College of  Lifestyle Medicine (ACLM).  -Type 2 diabetes is known to coexist with other important comorbidities such as high blood pressure and high cholesterol.  It is critical to control not only the  diabetes but also the high blood pressure and high cholesterol to minimize and delay the risk of complications including coronary artery disease, stroke, amputations, blindness, etc.  The good news is that this diet recommendation for type 2 diabetes is also very helpful for managing high cholesterol and high blood blood pressure.  - Studies showed that people with diabetes will benefit from a class of medications known as ACE inhibitors and statins.  Unless there are specific reasons not to be on these medications, the standard of care is to consider getting one from these groups of medications at an optimal doses.  These medications are generally considered safe and proven to help protect the heart and the kidneys.    - People with diabetes are encouraged to initiate and maintain regular follow-up with eye doctors, foot doctors, dentists , and if necessary heart and kidney doctors.     - It is highly recommended that people with diabetes quit smoking or stay away from smoking, and get yearly  flu vaccine and pneumonia vaccine at least every 5 years.  See above for additional recommendations on exercise, sleep, stress management , and healthy social connections.      

## 2021-11-29 NOTE — Progress Notes (Signed)
11/29/2021, 10:03 AM  Endocrinology follow-up note   Subjective:    Patient ID: Julian Richardson, male    DOB: [birth year].  Julian Richardson is being seen in follow-up after he was seen in consultation for management of obesity requested by Dr. Avon Gully.    Past Medical History:  Diagnosis Date   ADHD (attention deficit hyperactivity disorder)    Diabetes mellitus without complication (HCC)    Gout    Hypertension     Past Surgical History:  Procedure Laterality Date   GASTRIC BYPASS      Social History   Socioeconomic History   Marital status: Single    Spouse name: Not on file   Number of children: Not on file   Years of education: Not on file   Highest education level: Not on file  Occupational History   Not on file  Tobacco Use   Smoking status: Never   Smokeless tobacco: Never  Vaping Use   Vaping Use: Never used  Substance and Sexual Activity   Alcohol use: No   Drug use: No   Sexual activity: Not on file  Other Topics Concern   Not on file  Social History Narrative   Not on file   Social Determinants of Health   Financial Resource Strain: Not on file  Food Insecurity: Not on file  Transportation Needs: Not on file  Physical Activity: Not on file  Stress: Not on file  Social Connections: Not on file    Family History  Problem Relation Age of Onset   Hypertension Mother    Diabetes Mother    Hypertension Father    Hyperlipidemia Father    Heart attack Father     Outpatient Encounter Medications as of 11/29/2021  Medication Sig   acetaminophen (TYLENOL) 500 MG tablet Take 1,000 mg by mouth every 6 (six) hours as needed for mild pain.   albuterol (VENTOLIN HFA) 108 (90 Base) MCG/ACT inhaler Inhale 1-2 puffs into the lungs every 6 (six) hours as needed for wheezing or shortness of breath.   allopurinol (ZYLOPRIM) 100 MG tablet Take 100 mg by mouth daily.    amLODipine (NORVASC) 5 MG tablet TAKE ONE TABLET BY MOUTH TWICE DAILY. (Patient taking differently: daily.)   amphetamine-dextroamphetamine (ADDERALL XR) 30 MG 24 hr capsule Take 30 mg by mouth daily.   Cholecalciferol (VITAMIN D-3 PO) Take 1 tablet by mouth daily.   Coenzyme Q10 (COQ10 PO) Take 1 tablet by mouth daily.   colchicine 0.6 MG tablet Take by mouth as needed.   fluticasone (FLONASE) 50 MCG/ACT nasal spray Place 1 spray into both nostrils daily for 7 days.   Ginkgo Biloba 40 MG TABS Take 1 tablet by mouth daily.   hydrOXYzine (VISTARIL) 25 MG capsule Take 25 mg by mouth 3 (three) times daily as needed.   lisinopril (ZESTRIL) 40 MG tablet Take 40 mg by mouth daily.   metoprolol tartrate (LOPRESSOR) 25 MG tablet Take 25 mg by mouth 2 (two) times daily.   Omega-3 Fatty Acids (OMEGA-3 FISH OIL PO) Take 1 capsule by mouth daily.   omeprazole (  PRILOSEC) 20 MG capsule Take 20 mg by mouth daily.   tamsulosin (FLOMAX) 0.4 MG CAPS capsule Take 0.4 mg by mouth daily.   venlafaxine XR (EFFEXOR-XR) 150 MG 24 hr capsule daily with breakfast.   [DISCONTINUED] Chlorpheniramine Maleate (ALLERGY PO) Take 1 tablet by mouth daily as needed (allergies).   [DISCONTINUED] hydrochlorothiazide (HYDRODIURIL) 12.5 MG tablet Take 12.5 mg by mouth daily. (Patient not taking: Reported on 08/29/2021)   No facility-administered encounter medications on file as of 11/29/2021.    ALLERGIES: Allergies  Allergen Reactions   Ibuprofen Other (See Comments)    Stomach upset. Stomach upset.   Morphine And Related     Eyes burning   Nsaids Other (See Comments)    Stomach upset.   Tolmetin Other (See Comments)    Stomach upset.   Aspirin Nausea Only   Reglan [Metoclopramide]     Akathisia type reaction     VACCINATION STATUS:  There is no immunization history on file for this patient.  HPI History is obtained directly from the patient.  He gives history of being overweight/obese most of his adult life.  He  weighed upto maximum of 360 pounds before he underwent bariatric surgery in 2011.  Following his surgery, subsequently lost about 80 pounds down to 279 pounds.  However, over the next subsequent years progressively gained 72 pounds back to his current level of 351 pounds.  He gained 4 pounds since last visit.  Admittedly, he has not made significant changes in his lifestyle.   He has history of prediabetes.  He also has hyperlipidemia, hypertension, sleep apnea.  He is dealing with gastroesophageal reflux disease, diffuse osteoarthritis, generalized mood disorder, hypertension, gout. He is on amlodipine 5 mg p.o. daily, pain medications including acetaminophen, Adderall Exar, colchicine, bronchodilators, lisinopril, metoprolol, omega-3 fatty acids, Prilosec, tamsulosin, Effexor, allopurinol, CoQ10. He is not particularly active, leads mainly sedentary life. He denies any history of thyroid, adrenal, pituitary dysfunctions.  Review of Systems  Constitutional:  Negative for chills, fatigue, fever and unexpected weight change.  HENT:  Negative for dental problem, mouth sores and trouble swallowing.   Eyes:  Negative for visual disturbance.  Respiratory:  Negative for cough, choking, chest tightness, shortness of breath and wheezing.   Cardiovascular:  Negative for chest pain, palpitations and leg swelling.  Gastrointestinal:  Negative for abdominal distention, abdominal pain, constipation, diarrhea, nausea and vomiting.  Endocrine: Negative for polydipsia, polyphagia and polyuria.  Genitourinary:  Negative for dysuria, flank pain, hematuria and urgency.  Musculoskeletal:  Negative for back pain, gait problem, myalgias and neck pain.  Skin:  Negative for pallor, rash and wound.  Neurological:  Negative for seizures, syncope, weakness, numbness and headaches.  Psychiatric/Behavioral: Negative.  Negative for confusion and dysphoric mood.      Objective:       11/29/2021    8:57 AM 08/29/2021    9:47  AM 03/28/2021    1:49 PM  Vitals with BMI  Height 6\' 0"  6\' 0"    Weight 351 lbs 6 oz 347 lbs 6 oz   BMI 47.65 47.11   Systolic 124 142  Diastolic 78 88 80  Pulse 84 68 103    BP 124/78   Pulse 84   Ht 6' (1.829 m)   Wt (!) 351 lb 6.4 oz (159.4 kg)   BMI 47.66 kg/m   Wt Readings from Last 3 Encounters:  11/29/21 (!) 351 lb 6.4 oz (159.4 kg)  08/29/21 (!) 347 lb 6.4 oz (157.6 kg)  03/28/21 (!) 340 lb 1.6 oz (154.3 kg)         CMP ( most recent) CMP     Component Value Date/Time   NA 140 07/25/2021 0000   K 4.0 07/25/2021 0000   CL 104 07/25/2021 0000   CO2 25 (A) 07/25/2021 0000   GLUCOSE 120 (H) 03/28/2021 1300   BUN 15 07/25/2021 0000   CREATININE 1.0 07/25/2021 0000   CREATININE 1.06 03/28/2021 1300   CALCIUM 9.2 07/25/2021 0000   PROT 8.5 (H) 08/17/2014 0945   ALBUMIN 3.9 07/25/2021 0000   AST 24 07/25/2021 0000   ALT 27 07/25/2021 0000   ALKPHOS 70 07/25/2021 0000   BILITOT 1.8 (H) 08/17/2014 0945   GFRNONAA >60 03/28/2021 1300   GFRAA >90 08/17/2014 0945     Diabetic Labs (most recent): Lab Results  Component Value Date   HGBA1C 5.6 07/25/2021     Lipid Panel ( most recent) Lipid Panel     Component Value Date/Time   CHOL 181 07/25/2021 0000   TRIG 149 07/25/2021 0000   HDL 49 07/25/2021 0000   LDLCALC 106 07/25/2021 0000      Lab Results  Component Value Date   TSH 0.96 07/25/2021      Assessment & Plan:   1. Morbid obesity (HCC) 2. Essential hypertension, benign 3. diabetes 4. Mixed hyperlipidemia  - Revel Stellmach returns with 4 more pounds of weight gain.  He did not engage in lifestyle medicine recommendations given during his last visit.   His options are limited.  He does not want to consider surgery again.  The patient has did not cover Saxenda nor  [id number].  His best option is still taking progressively to lifestyle medicine. - he acknowledges that there is a room for improvement in his food and drink choices. -  Suggestion is made for him to avoid simple carbohydrates  from his diet including Cakes, Sweet Desserts, Ice Cream, Soda (diet and regular), Sweet Tea, Candies, Chips, Cookies, Store Bought Juices, Alcohol , Artificial Sweeteners,  Coffee Creamer, and "Sugar-free" Products, Lemonade. This will help patient to have more stable blood glucose profile and potentially avoid unintended weight gain.  The following Lifestyle Medicine recommendations according to American College of Lifestyle Medicine  [facility]) were discussed and and offered to patient and he  agrees to start the journey:  A. Whole Foods, Plant-Based Nutrition comprising of fruits and vegetables, plant-based proteins, whole-grain carbohydrates was discussed in detail with the patient.   A list for source of those nutrients were also provided to the patient.  Patient will use only water or unsweetened tea for hydration. B.  The need to stay away from risky substances including alcohol, smoking; obtaining 7 to 9 hours of restorative sleep, at least 150 minutes of moderate intensity exercise weekly, the importance of healthy social connections,  and stress management techniques were discussed. C.  A full color page of  Calorie density of various food groups per pound showing examples of each food groups was provided to the patient.  He does not have diabetes, does not qualify for Mounjaro, Ozempic, Victoza.  - Specific targets for  A1c;  LDL, HDL,  and Triglycerides were discussed with the patient. -Brochure for bariatric surgery was provided today.  2) Blood Pressure /Hypertension:  his blood pressure is un controlled to target.   he is advised to continue his current medications including amlodipine 5 mg p.o. daily, hydrochlorothiazide 12.5 mg p.o. daily with breakfast .  He is also on lisinopril 40 mg p.o. daily.  Lifestyle medicine will help him de-escalate on hypertension medications as well. 3) Lipids/Hyperlipidemia:   Review of his recent  lipid panel showed uncontrolled  LDL at 106 .  We will have repeat fasting lipid panel before next visit.   -If his LDL remains above 100, he will be considered for statin therapy intervention.    he  is advised to continue    omega-3 fatty acids 1000 mg p.o. twice daily.  Lifestyle medicine will help him avoid statins.    4)  Weight/Diet:  Body mass index is 47.66 kg/m.  -   clearly complicating his diabetes care.   he is  a candidate for weight loss. I discussed with him the fact that loss of 5 - 10% of his  current body weight will have the most impact on his diabetes management.  He does not want to reconsider surgery.  The above detailed  ACLM recommendations for nutrition, exercise, sleep, social life, avoidance of risky substances, the need for restorative sleep   information will also detailed on discharge instructions.  His other option is going on GLP-1 receptor agonists which will be considered after next visit depending on his engagement.  5) Chronic Care/Health Maintenance:  -he  is on ACEI medications and  is encouraged to initiate and continue to follow up with Ophthalmology, Dentist,  Podiatrist at least yearly or according to recommendations, and advised to   stay away from smoking. I have recommended yearly flu vaccine and pneumonia vaccine at least every 5 years; moderate intensity exercise for up to 150 minutes weekly; and  sleep for 7- 9 hours a day.  He is advised to continue his vitamin D 3 supplements 2000 units daily.    - he is  advised to maintain close follow up with  Avon Gully ,MD for primary care needs, as well as his other providers for optimal and coordinated care.   I spent 35 minutes in the care of the patient today including review of labs from Thyroid Function, CMP, and other relevant labs ; imaging/biopsy records (current and previous including abstractions from other facilities); face-to-face time discussing  his lab results and symptoms, medications doses,  his options of short and long term treatment based on the latest standards of care / guidelines;   and documenting the encounter.  Nestor Lewandowsky  participated in the discussions, expressed understanding, and voiced agreement with the above plans.  All questions were answered to his satisfaction. he is encouraged to contact clinic should he have any questions or concerns prior to his return visit.    Follow up plan: - Return in about 3 months (around 03/01/2022) for F/U with Pre-visit Labs, A1c -NV.  Marquis Lunch, MD [facility] [address] [place], [place] [address] Phone: [phone]  Fax: [phone]    11/29/2021, 10:03 AM  This note was partially dictated with voice recognition software. Similar sounding words can be transcribed inadequately or may not  be corrected upon review.

## 2021-11-29 NOTE — Telephone Encounter (Signed)
2nd no show for new pt visit - dismissed from [facility] [employer]

## 2021-12-02 NOTE — Telephone Encounter (Signed)
2nd no show for NP visit, unable to reschedule with [facility]

## 2021-12-08 DIAGNOSIS — E1165 Type 2 diabetes mellitus with hyperglycemia: Secondary | ICD-10-CM | POA: Diagnosis not present

## 2021-12-08 DIAGNOSIS — I10 Essential (primary) hypertension: Secondary | ICD-10-CM | POA: Diagnosis not present

## 2021-12-11 DIAGNOSIS — R69 Illness, unspecified: Secondary | ICD-10-CM | POA: Diagnosis not present

## 2021-12-11 DIAGNOSIS — F330 Major depressive disorder, recurrent, mild: Secondary | ICD-10-CM | POA: Diagnosis not present

## 2021-12-19 DIAGNOSIS — R69 Illness, unspecified: Secondary | ICD-10-CM | POA: Diagnosis not present

## 2021-12-19 DIAGNOSIS — F329 Major depressive disorder, single episode, unspecified: Secondary | ICD-10-CM | POA: Diagnosis not present

## 2021-12-21 DIAGNOSIS — R030 Elevated blood-pressure reading, without diagnosis of hypertension: Secondary | ICD-10-CM | POA: Diagnosis not present

## 2021-12-21 DIAGNOSIS — E559 Vitamin D deficiency, unspecified: Secondary | ICD-10-CM | POA: Diagnosis not present

## 2021-12-21 DIAGNOSIS — Z789 Other specified health status: Secondary | ICD-10-CM | POA: Diagnosis not present

## 2021-12-21 DIAGNOSIS — Z79899 Other long term (current) drug therapy: Secondary | ICD-10-CM | POA: Diagnosis not present

## 2021-12-21 DIAGNOSIS — Z0130 Encounter for examination of blood pressure without abnormal findings: Secondary | ICD-10-CM | POA: Diagnosis not present

## 2021-12-21 DIAGNOSIS — R69 Illness, unspecified: Secondary | ICD-10-CM | POA: Diagnosis not present

## 2021-12-21 DIAGNOSIS — Z87898 Personal history of other specified conditions: Secondary | ICD-10-CM | POA: Diagnosis not present

## 2021-12-21 DIAGNOSIS — Z1211 Encounter for screening for malignant neoplasm of colon: Secondary | ICD-10-CM | POA: Diagnosis not present

## 2021-12-21 DIAGNOSIS — Z6841 Body Mass Index (BMI) 40.0 and over, adult: Secondary | ICD-10-CM | POA: Diagnosis not present

## 2021-12-25 DIAGNOSIS — Z79899 Other long term (current) drug therapy: Secondary | ICD-10-CM | POA: Diagnosis not present

## 2021-12-26 DIAGNOSIS — R69 Illness, unspecified: Secondary | ICD-10-CM | POA: Diagnosis not present

## 2021-12-26 DIAGNOSIS — F330 Major depressive disorder, recurrent, mild: Secondary | ICD-10-CM | POA: Diagnosis not present

## 2021-12-26 DIAGNOSIS — Z79899 Other long term (current) drug therapy: Secondary | ICD-10-CM | POA: Diagnosis not present

## 2021-12-26 DIAGNOSIS — Z87898 Personal history of other specified conditions: Secondary | ICD-10-CM | POA: Diagnosis not present

## 2021-12-26 DIAGNOSIS — Z0130 Encounter for examination of blood pressure without abnormal findings: Secondary | ICD-10-CM | POA: Diagnosis not present

## 2021-12-26 DIAGNOSIS — E559 Vitamin D deficiency, unspecified: Secondary | ICD-10-CM | POA: Diagnosis not present

## 2021-12-26 DIAGNOSIS — Z789 Other specified health status: Secondary | ICD-10-CM | POA: Diagnosis not present

## 2021-12-26 DIAGNOSIS — I10 Essential (primary) hypertension: Secondary | ICD-10-CM | POA: Diagnosis not present

## 2021-12-26 DIAGNOSIS — F909 Attention-deficit hyperactivity disorder, unspecified type: Secondary | ICD-10-CM | POA: Diagnosis not present

## 2021-12-26 DIAGNOSIS — Z1211 Encounter for screening for malignant neoplasm of colon: Secondary | ICD-10-CM | POA: Diagnosis not present

## 2021-12-26 DIAGNOSIS — R030 Elevated blood-pressure reading, without diagnosis of hypertension: Secondary | ICD-10-CM | POA: Diagnosis not present

## 2021-12-26 DIAGNOSIS — Z6841 Body Mass Index (BMI) 40.0 and over, adult: Secondary | ICD-10-CM | POA: Diagnosis not present

## 2021-12-31 DIAGNOSIS — Z79899 Other long term (current) drug therapy: Secondary | ICD-10-CM | POA: Diagnosis not present

## 2021-12-31 DIAGNOSIS — Z87898 Personal history of other specified conditions: Secondary | ICD-10-CM | POA: Diagnosis not present

## 2021-12-31 DIAGNOSIS — R809 Proteinuria, unspecified: Secondary | ICD-10-CM | POA: Diagnosis not present

## 2021-12-31 DIAGNOSIS — F909 Attention-deficit hyperactivity disorder, unspecified type: Secondary | ICD-10-CM | POA: Diagnosis not present

## 2021-12-31 DIAGNOSIS — R69 Illness, unspecified: Secondary | ICD-10-CM | POA: Diagnosis not present

## 2021-12-31 DIAGNOSIS — Z1211 Encounter for screening for malignant neoplasm of colon: Secondary | ICD-10-CM | POA: Diagnosis not present

## 2021-12-31 DIAGNOSIS — Z789 Other specified health status: Secondary | ICD-10-CM | POA: Diagnosis not present

## 2022-01-13 DIAGNOSIS — M109 Gout, unspecified: Secondary | ICD-10-CM | POA: Diagnosis not present

## 2022-01-13 DIAGNOSIS — R809 Proteinuria, unspecified: Secondary | ICD-10-CM | POA: Diagnosis not present

## 2022-01-13 DIAGNOSIS — I129 Hypertensive chronic kidney disease with stage 1 through stage 4 chronic kidney disease, or unspecified chronic kidney disease: Secondary | ICD-10-CM | POA: Diagnosis not present

## 2022-01-13 DIAGNOSIS — N182 Chronic kidney disease, stage 2 (mild): Secondary | ICD-10-CM | POA: Diagnosis not present

## 2022-01-13 DIAGNOSIS — Z6841 Body Mass Index (BMI) 40.0 and over, adult: Secondary | ICD-10-CM | POA: Diagnosis not present

## 2022-01-13 DIAGNOSIS — L299 Pruritus, unspecified: Secondary | ICD-10-CM | POA: Diagnosis not present

## 2022-01-28 DIAGNOSIS — R69 Illness, unspecified: Secondary | ICD-10-CM | POA: Diagnosis not present

## 2022-01-28 DIAGNOSIS — I10 Essential (primary) hypertension: Secondary | ICD-10-CM | POA: Diagnosis not present

## 2022-01-28 DIAGNOSIS — E119 Type 2 diabetes mellitus without complications: Secondary | ICD-10-CM | POA: Diagnosis not present

## 2022-02-03 DIAGNOSIS — Z0130 Encounter for examination of blood pressure without abnormal findings: Secondary | ICD-10-CM | POA: Diagnosis not present

## 2022-02-03 DIAGNOSIS — Z79899 Other long term (current) drug therapy: Secondary | ICD-10-CM | POA: Diagnosis not present

## 2022-02-03 DIAGNOSIS — R7303 Prediabetes: Secondary | ICD-10-CM | POA: Diagnosis not present

## 2022-02-03 DIAGNOSIS — R030 Elevated blood-pressure reading, without diagnosis of hypertension: Secondary | ICD-10-CM | POA: Diagnosis not present

## 2022-02-03 DIAGNOSIS — Z760 Encounter for issue of repeat prescription: Secondary | ICD-10-CM | POA: Diagnosis not present

## 2022-02-03 DIAGNOSIS — Z6841 Body Mass Index (BMI) 40.0 and over, adult: Secondary | ICD-10-CM | POA: Diagnosis not present

## 2022-02-03 DIAGNOSIS — G4733 Obstructive sleep apnea (adult) (pediatric): Secondary | ICD-10-CM | POA: Diagnosis not present

## 2022-02-03 DIAGNOSIS — I159 Secondary hypertension, unspecified: Secondary | ICD-10-CM | POA: Diagnosis not present

## 2022-02-03 DIAGNOSIS — Z1211 Encounter for screening for malignant neoplasm of colon: Secondary | ICD-10-CM | POA: Diagnosis not present

## 2022-02-03 DIAGNOSIS — R69 Illness, unspecified: Secondary | ICD-10-CM | POA: Diagnosis not present

## 2022-02-03 DIAGNOSIS — Z789 Other specified health status: Secondary | ICD-10-CM | POA: Diagnosis not present

## 2022-02-28 DIAGNOSIS — E119 Type 2 diabetes mellitus without complications: Secondary | ICD-10-CM | POA: Diagnosis not present

## 2022-02-28 DIAGNOSIS — I10 Essential (primary) hypertension: Secondary | ICD-10-CM | POA: Diagnosis not present

## 2022-03-03 ENCOUNTER — Ambulatory Visit: Payer: Medicare HMO | Admitting: "Endocrinology

## 2022-03-04 DIAGNOSIS — R7303 Prediabetes: Secondary | ICD-10-CM | POA: Diagnosis not present

## 2022-03-04 DIAGNOSIS — G4733 Obstructive sleep apnea (adult) (pediatric): Secondary | ICD-10-CM | POA: Diagnosis not present

## 2022-03-04 DIAGNOSIS — R69 Illness, unspecified: Secondary | ICD-10-CM | POA: Diagnosis not present

## 2022-03-04 DIAGNOSIS — Z1211 Encounter for screening for malignant neoplasm of colon: Secondary | ICD-10-CM | POA: Diagnosis not present

## 2022-03-04 DIAGNOSIS — R892 Abnormal level of other drugs, medicaments and biological substances in specimens from other organs, systems and tissues: Secondary | ICD-10-CM | POA: Diagnosis not present

## 2022-03-04 DIAGNOSIS — Z789 Other specified health status: Secondary | ICD-10-CM | POA: Diagnosis not present

## 2022-03-08 DIAGNOSIS — R69 Illness, unspecified: Secondary | ICD-10-CM | POA: Diagnosis not present

## 2022-03-08 DIAGNOSIS — I10 Essential (primary) hypertension: Secondary | ICD-10-CM | POA: Diagnosis not present

## 2022-03-08 DIAGNOSIS — G4733 Obstructive sleep apnea (adult) (pediatric): Secondary | ICD-10-CM | POA: Diagnosis not present

## 2022-03-08 DIAGNOSIS — R7303 Prediabetes: Secondary | ICD-10-CM | POA: Diagnosis not present

## 2022-03-08 DIAGNOSIS — R809 Proteinuria, unspecified: Secondary | ICD-10-CM | POA: Diagnosis not present

## 2022-03-08 DIAGNOSIS — R892 Abnormal level of other drugs, medicaments and biological substances in specimens from other organs, systems and tissues: Secondary | ICD-10-CM | POA: Diagnosis not present

## 2022-03-08 DIAGNOSIS — Z789 Other specified health status: Secondary | ICD-10-CM | POA: Diagnosis not present

## 2022-03-08 DIAGNOSIS — R030 Elevated blood-pressure reading, without diagnosis of hypertension: Secondary | ICD-10-CM | POA: Diagnosis not present

## 2022-03-08 DIAGNOSIS — Z1211 Encounter for screening for malignant neoplasm of colon: Secondary | ICD-10-CM | POA: Diagnosis not present

## 2022-03-08 DIAGNOSIS — Z79899 Other long term (current) drug therapy: Secondary | ICD-10-CM | POA: Diagnosis not present

## 2022-03-08 DIAGNOSIS — Z6841 Body Mass Index (BMI) 40.0 and over, adult: Secondary | ICD-10-CM | POA: Diagnosis not present

## 2022-03-10 DIAGNOSIS — F411 Generalized anxiety disorder: Secondary | ICD-10-CM | POA: Diagnosis not present

## 2022-03-10 DIAGNOSIS — R69 Illness, unspecified: Secondary | ICD-10-CM | POA: Diagnosis not present

## 2022-03-10 DIAGNOSIS — F902 Attention-deficit hyperactivity disorder, combined type: Secondary | ICD-10-CM | POA: Diagnosis not present

## 2022-03-30 DIAGNOSIS — I10 Essential (primary) hypertension: Secondary | ICD-10-CM | POA: Diagnosis not present

## 2022-03-30 DIAGNOSIS — E119 Type 2 diabetes mellitus without complications: Secondary | ICD-10-CM | POA: Diagnosis not present

## 2022-04-01 DIAGNOSIS — R69 Illness, unspecified: Secondary | ICD-10-CM | POA: Diagnosis not present

## 2022-04-01 DIAGNOSIS — F902 Attention-deficit hyperactivity disorder, combined type: Secondary | ICD-10-CM | POA: Diagnosis not present

## 2022-04-01 DIAGNOSIS — F411 Generalized anxiety disorder: Secondary | ICD-10-CM | POA: Diagnosis not present

## 2022-04-10 DIAGNOSIS — F411 Generalized anxiety disorder: Secondary | ICD-10-CM | POA: Diagnosis not present

## 2022-04-10 DIAGNOSIS — F902 Attention-deficit hyperactivity disorder, combined type: Secondary | ICD-10-CM | POA: Diagnosis not present

## 2022-04-10 DIAGNOSIS — R69 Illness, unspecified: Secondary | ICD-10-CM | POA: Diagnosis not present

## 2022-04-30 DIAGNOSIS — I10 Essential (primary) hypertension: Secondary | ICD-10-CM | POA: Diagnosis not present

## 2022-04-30 DIAGNOSIS — E119 Type 2 diabetes mellitus without complications: Secondary | ICD-10-CM | POA: Diagnosis not present

## 2022-05-09 DIAGNOSIS — N182 Chronic kidney disease, stage 2 (mild): Secondary | ICD-10-CM | POA: Diagnosis not present

## 2022-05-12 DIAGNOSIS — Z6841 Body Mass Index (BMI) 40.0 and over, adult: Secondary | ICD-10-CM | POA: Diagnosis not present

## 2022-05-12 DIAGNOSIS — M109 Gout, unspecified: Secondary | ICD-10-CM | POA: Diagnosis not present

## 2022-05-12 DIAGNOSIS — R809 Proteinuria, unspecified: Secondary | ICD-10-CM | POA: Diagnosis not present

## 2022-05-12 DIAGNOSIS — I129 Hypertensive chronic kidney disease with stage 1 through stage 4 chronic kidney disease, or unspecified chronic kidney disease: Secondary | ICD-10-CM | POA: Diagnosis not present

## 2022-05-12 DIAGNOSIS — N182 Chronic kidney disease, stage 2 (mild): Secondary | ICD-10-CM | POA: Diagnosis not present

## 2022-05-12 DIAGNOSIS — L299 Pruritus, unspecified: Secondary | ICD-10-CM | POA: Diagnosis not present

## 2022-05-23 DIAGNOSIS — R69 Illness, unspecified: Secondary | ICD-10-CM | POA: Diagnosis not present

## 2022-05-23 DIAGNOSIS — F411 Generalized anxiety disorder: Secondary | ICD-10-CM | POA: Diagnosis not present

## 2022-05-23 DIAGNOSIS — F902 Attention-deficit hyperactivity disorder, combined type: Secondary | ICD-10-CM | POA: Diagnosis not present

## 2022-05-31 DIAGNOSIS — E119 Type 2 diabetes mellitus without complications: Secondary | ICD-10-CM | POA: Diagnosis not present

## 2022-05-31 DIAGNOSIS — I10 Essential (primary) hypertension: Secondary | ICD-10-CM | POA: Diagnosis not present

## 2022-06-26 IMAGING — DX DG CHEST 1V PORT
1 series · 1 of 1 positions shown · non-contrast
Comparison: Chest radiograph 08/16/2014

CLINICAL DATA: Positive COVID test

EXAM:
PORTABLE CHEST 1 VIEW

[chest ap]
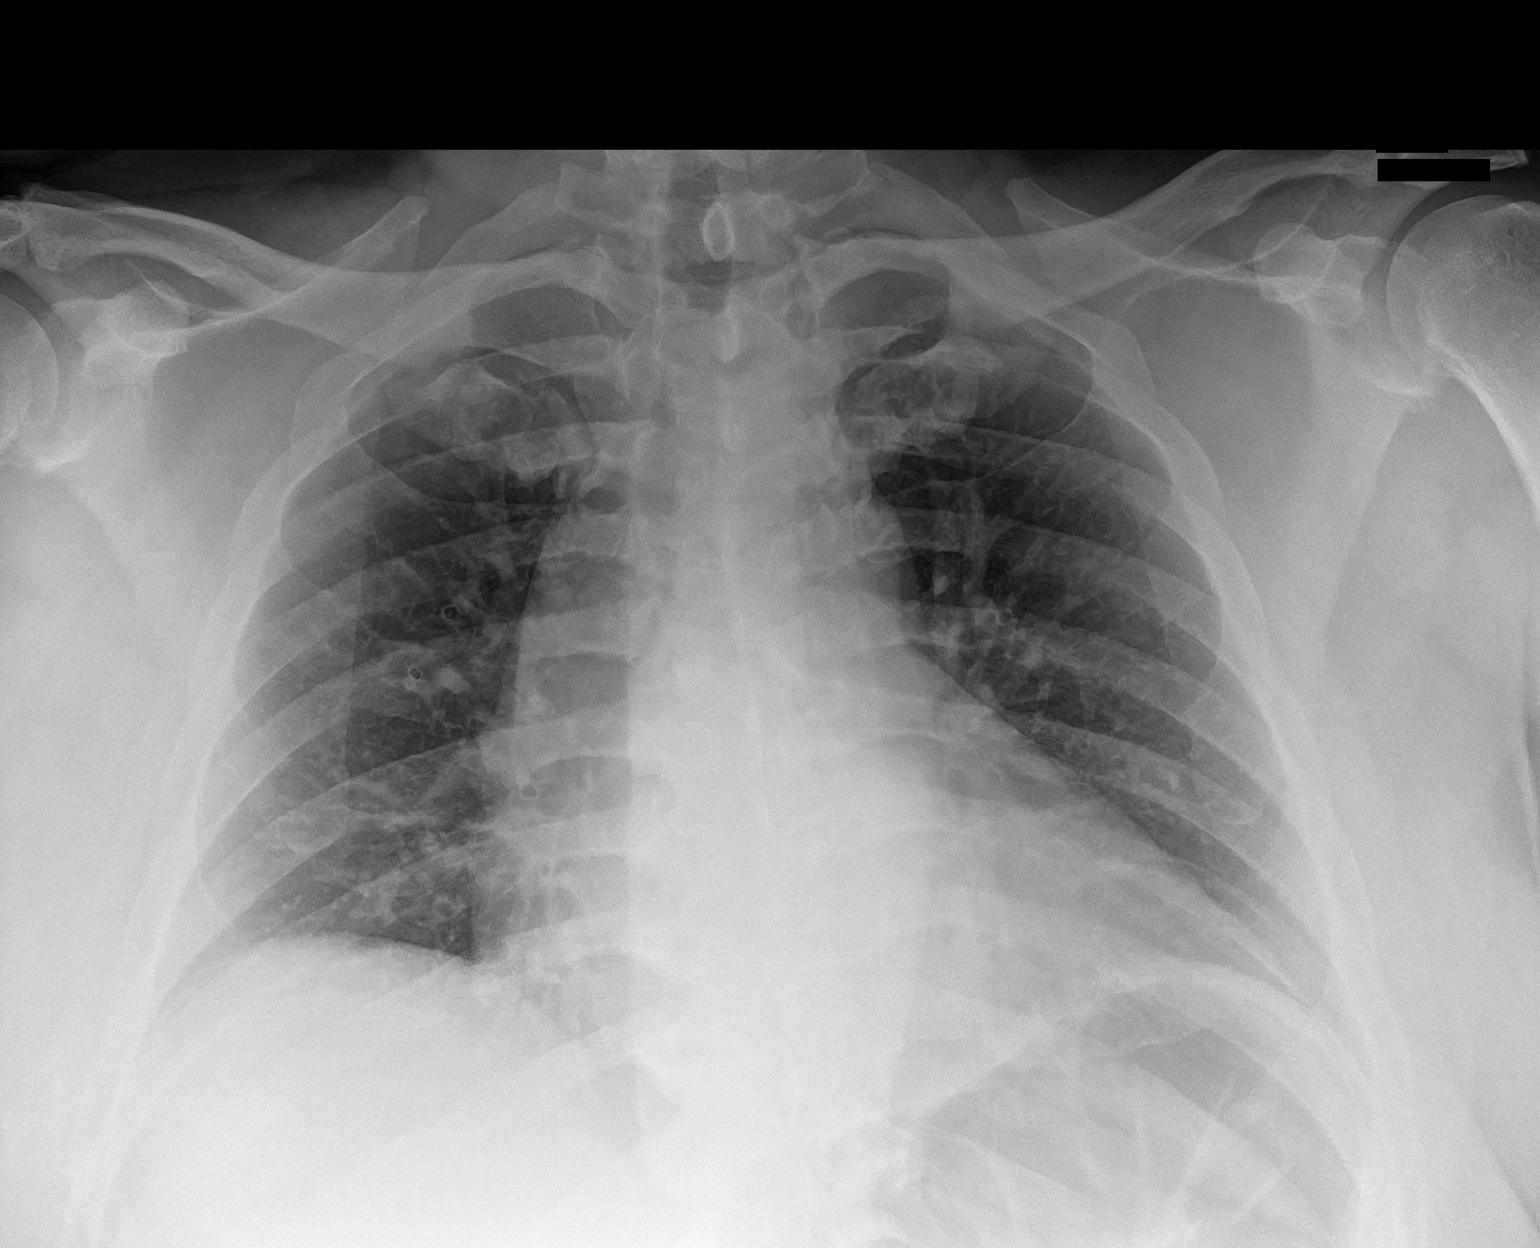

[1 of 1 positions shown; findings below may reference images not displayed]

FINDINGS: The heart is enlarged. The upper mediastinal contours are within
normal limits.

There is no focal consolidation or pulmonary edema. There is no
pleural effusion or pneumothorax.

There is no acute osseous abnormality.
IMPRESSION: Cardiomegaly. Otherwise, no radiographic evidence of acute
cardiopulmonary process.

## 2022-06-29 DIAGNOSIS — I10 Essential (primary) hypertension: Secondary | ICD-10-CM | POA: Diagnosis not present

## 2022-06-29 DIAGNOSIS — E119 Type 2 diabetes mellitus without complications: Secondary | ICD-10-CM | POA: Diagnosis not present

## 2022-07-30 DIAGNOSIS — I10 Essential (primary) hypertension: Secondary | ICD-10-CM | POA: Diagnosis not present

## 2022-07-30 DIAGNOSIS — E119 Type 2 diabetes mellitus without complications: Secondary | ICD-10-CM | POA: Diagnosis not present

## 2022-08-05 DIAGNOSIS — R69 Illness, unspecified: Secondary | ICD-10-CM | POA: Diagnosis not present

## 2022-08-05 DIAGNOSIS — F411 Generalized anxiety disorder: Secondary | ICD-10-CM | POA: Diagnosis not present

## 2022-08-05 DIAGNOSIS — F902 Attention-deficit hyperactivity disorder, combined type: Secondary | ICD-10-CM | POA: Diagnosis not present

## 2022-09-02 DIAGNOSIS — N5201 Erectile dysfunction due to arterial insufficiency: Secondary | ICD-10-CM | POA: Diagnosis not present

## 2022-09-02 DIAGNOSIS — E349 Endocrine disorder, unspecified: Secondary | ICD-10-CM | POA: Diagnosis not present

## 2022-09-02 DIAGNOSIS — N401 Enlarged prostate with lower urinary tract symptoms: Secondary | ICD-10-CM | POA: Diagnosis not present

## 2022-09-02 DIAGNOSIS — R351 Nocturia: Secondary | ICD-10-CM | POA: Diagnosis not present

## 2022-09-04 DIAGNOSIS — E291 Testicular hypofunction: Secondary | ICD-10-CM | POA: Diagnosis not present

## 2022-09-04 DIAGNOSIS — N182 Chronic kidney disease, stage 2 (mild): Secondary | ICD-10-CM | POA: Diagnosis not present

## 2022-09-08 DIAGNOSIS — I129 Hypertensive chronic kidney disease with stage 1 through stage 4 chronic kidney disease, or unspecified chronic kidney disease: Secondary | ICD-10-CM | POA: Diagnosis not present

## 2022-09-08 DIAGNOSIS — L299 Pruritus, unspecified: Secondary | ICD-10-CM | POA: Diagnosis not present

## 2022-09-08 DIAGNOSIS — N182 Chronic kidney disease, stage 2 (mild): Secondary | ICD-10-CM | POA: Diagnosis not present

## 2022-09-08 DIAGNOSIS — M109 Gout, unspecified: Secondary | ICD-10-CM | POA: Diagnosis not present

## 2022-09-08 DIAGNOSIS — Z6841 Body Mass Index (BMI) 40.0 and over, adult: Secondary | ICD-10-CM | POA: Diagnosis not present

## 2022-09-08 DIAGNOSIS — R809 Proteinuria, unspecified: Secondary | ICD-10-CM | POA: Diagnosis not present

## 2022-09-11 DIAGNOSIS — Z1389 Encounter for screening for other disorder: Secondary | ICD-10-CM | POA: Diagnosis not present

## 2022-09-11 DIAGNOSIS — Z0001 Encounter for general adult medical examination with abnormal findings: Secondary | ICD-10-CM | POA: Diagnosis not present

## 2022-09-11 DIAGNOSIS — E119 Type 2 diabetes mellitus without complications: Secondary | ICD-10-CM | POA: Diagnosis not present

## 2022-09-11 DIAGNOSIS — M109 Gout, unspecified: Secondary | ICD-10-CM | POA: Diagnosis not present

## 2022-09-11 DIAGNOSIS — I10 Essential (primary) hypertension: Secondary | ICD-10-CM | POA: Diagnosis not present

## 2022-09-11 DIAGNOSIS — F909 Attention-deficit hyperactivity disorder, unspecified type: Secondary | ICD-10-CM | POA: Diagnosis not present

## 2022-09-11 DIAGNOSIS — Z1331 Encounter for screening for depression: Secondary | ICD-10-CM | POA: Diagnosis not present

## 2022-09-11 DIAGNOSIS — K219 Gastro-esophageal reflux disease without esophagitis: Secondary | ICD-10-CM | POA: Diagnosis not present

## 2022-09-11 DIAGNOSIS — F411 Generalized anxiety disorder: Secondary | ICD-10-CM | POA: Diagnosis not present

## 2022-09-11 DIAGNOSIS — M1990 Unspecified osteoarthritis, unspecified site: Secondary | ICD-10-CM | POA: Diagnosis not present

## 2022-10-01 DIAGNOSIS — F909 Attention-deficit hyperactivity disorder, unspecified type: Secondary | ICD-10-CM | POA: Diagnosis not present

## 2022-10-01 DIAGNOSIS — F411 Generalized anxiety disorder: Secondary | ICD-10-CM | POA: Diagnosis not present

## 2022-10-01 DIAGNOSIS — E785 Hyperlipidemia, unspecified: Secondary | ICD-10-CM | POA: Diagnosis not present

## 2022-10-01 DIAGNOSIS — G4733 Obstructive sleep apnea (adult) (pediatric): Secondary | ICD-10-CM | POA: Diagnosis not present

## 2022-10-01 DIAGNOSIS — R609 Edema, unspecified: Secondary | ICD-10-CM | POA: Diagnosis not present

## 2022-10-01 DIAGNOSIS — E291 Testicular hypofunction: Secondary | ICD-10-CM | POA: Diagnosis not present

## 2022-10-01 DIAGNOSIS — J45909 Unspecified asthma, uncomplicated: Secondary | ICD-10-CM | POA: Diagnosis not present

## 2022-10-01 DIAGNOSIS — F329 Major depressive disorder, single episode, unspecified: Secondary | ICD-10-CM | POA: Diagnosis not present

## 2022-10-01 DIAGNOSIS — N400 Enlarged prostate without lower urinary tract symptoms: Secondary | ICD-10-CM | POA: Diagnosis not present

## 2022-10-01 DIAGNOSIS — I10 Essential (primary) hypertension: Secondary | ICD-10-CM | POA: Diagnosis not present

## 2022-10-01 DIAGNOSIS — N529 Male erectile dysfunction, unspecified: Secondary | ICD-10-CM | POA: Diagnosis not present

## 2022-10-12 DIAGNOSIS — E119 Type 2 diabetes mellitus without complications: Secondary | ICD-10-CM | POA: Diagnosis not present

## 2022-10-12 DIAGNOSIS — I10 Essential (primary) hypertension: Secondary | ICD-10-CM | POA: Diagnosis not present

## 2022-10-14 DIAGNOSIS — H6692 Otitis media, unspecified, left ear: Secondary | ICD-10-CM | POA: Diagnosis not present

## 2022-10-14 DIAGNOSIS — I10 Essential (primary) hypertension: Secondary | ICD-10-CM | POA: Diagnosis not present

## 2022-10-14 DIAGNOSIS — H6120 Impacted cerumen, unspecified ear: Secondary | ICD-10-CM | POA: Diagnosis not present

## 2022-11-03 DIAGNOSIS — F411 Generalized anxiety disorder: Secondary | ICD-10-CM | POA: Diagnosis not present

## 2022-11-03 DIAGNOSIS — F902 Attention-deficit hyperactivity disorder, combined type: Secondary | ICD-10-CM | POA: Diagnosis not present

## 2022-11-13 DIAGNOSIS — I10 Essential (primary) hypertension: Secondary | ICD-10-CM | POA: Diagnosis not present

## 2022-11-13 DIAGNOSIS — E119 Type 2 diabetes mellitus without complications: Secondary | ICD-10-CM | POA: Diagnosis not present

## 2022-12-14 DIAGNOSIS — E119 Type 2 diabetes mellitus without complications: Secondary | ICD-10-CM | POA: Diagnosis not present

## 2022-12-14 DIAGNOSIS — I10 Essential (primary) hypertension: Secondary | ICD-10-CM | POA: Diagnosis not present

## 2022-12-23 DIAGNOSIS — R891 Abnormal level of hormones in specimens from other organs, systems and tissues: Secondary | ICD-10-CM | POA: Diagnosis not present

## 2022-12-23 DIAGNOSIS — E1165 Type 2 diabetes mellitus with hyperglycemia: Secondary | ICD-10-CM | POA: Diagnosis not present

## 2022-12-23 DIAGNOSIS — E119 Type 2 diabetes mellitus without complications: Secondary | ICD-10-CM | POA: Diagnosis not present

## 2022-12-23 DIAGNOSIS — F411 Generalized anxiety disorder: Secondary | ICD-10-CM | POA: Diagnosis not present

## 2023-02-03 ENCOUNTER — Encounter: Payer: Self-pay | Admitting: Family Medicine

## 2023-02-03 ENCOUNTER — Ambulatory Visit: Payer: Medicare HMO | Admitting: Family Medicine

## 2023-02-03 VITALS — BP 150/109 | HR 96 | Ht 72.0 in | Wt 369.0 lb

## 2023-02-03 DIAGNOSIS — N400 Enlarged prostate without lower urinary tract symptoms: Secondary | ICD-10-CM | POA: Diagnosis not present

## 2023-02-03 DIAGNOSIS — G4733 Obstructive sleep apnea (adult) (pediatric): Secondary | ICD-10-CM

## 2023-02-03 DIAGNOSIS — R21 Rash and other nonspecific skin eruption: Secondary | ICD-10-CM

## 2023-02-03 DIAGNOSIS — E039 Hypothyroidism, unspecified: Secondary | ICD-10-CM | POA: Diagnosis not present

## 2023-02-03 DIAGNOSIS — E559 Vitamin D deficiency, unspecified: Secondary | ICD-10-CM

## 2023-02-03 DIAGNOSIS — I10 Essential (primary) hypertension: Secondary | ICD-10-CM

## 2023-02-03 DIAGNOSIS — G4730 Sleep apnea, unspecified: Secondary | ICD-10-CM

## 2023-02-03 DIAGNOSIS — F419 Anxiety disorder, unspecified: Secondary | ICD-10-CM | POA: Insufficient documentation

## 2023-02-03 DIAGNOSIS — Z1211 Encounter for screening for malignant neoplasm of colon: Secondary | ICD-10-CM | POA: Diagnosis not present

## 2023-02-03 DIAGNOSIS — R7301 Impaired fasting glucose: Secondary | ICD-10-CM

## 2023-02-03 DIAGNOSIS — Z1159 Encounter for screening for other viral diseases: Secondary | ICD-10-CM

## 2023-02-03 DIAGNOSIS — M109 Gout, unspecified: Secondary | ICD-10-CM | POA: Insufficient documentation

## 2023-02-03 DIAGNOSIS — Z114 Encounter for screening for human immunodeficiency virus [HIV]: Secondary | ICD-10-CM

## 2023-02-03 NOTE — Progress Notes (Signed)
New Patient Office Visit   Subjective   Patient ID: Julian Richardson, male    DOB: 1970-10-19  Age: 52 y.o. MRN: [id number]  CC:  Chief Complaint  Patient presents with   Establish Care    Patient is here to establish care. Complains of flare ups on his face in a butterfly shape.     HPI Julian Richardson 52 year old male, presents to establish care. He  has a past medical history of ADHD (attention deficit hyperactivity disorder), Gout, Hypertension, and Sleep apnea.  Hypertension: Patient here for  elevated blood pressure. He is exercising and is adherent to low salt diet.  Blood pressure is well controlled at home. Patient denies  cardiac symptoms chest pain, chest pressure/discomfort, dyspnea, fatigue, lower extremity edema, syncope, and tachypnea.  Cardiovascular risk factors: dyslipidemia, hypertension, male gender, obesity (BMI >= 30 kg/m2), and sedentary lifestyle.       Outpatient Encounter Medications as of 02/03/2023  Medication Sig   acetaminophen (TYLENOL) 500 MG tablet Take 1,000 mg by mouth every 6 (six) hours as needed for mild pain.   allopurinol (ZYLOPRIM) 100 MG tablet Take 100 mg by mouth daily.   amLODipine (NORVASC) 10 MG tablet Take 10 mg by mouth daily.   BOOSTRIX 5-2.5-18.5 LF-MCG/0.5 injection    Cholecalciferol (VITAMIN D-3 PO) Take 1 tablet by mouth daily.   Coenzyme Q10 (COQ10 PO) Take 1 tablet by mouth daily.   colchicine 0.6 MG tablet Take by mouth as needed.   gabapentin (NEURONTIN) 400 MG capsule Take 800 mg by mouth at bedtime.   Ginkgo Biloba 40 MG TABS Take 1 tablet by mouth daily.   hydrOXYzine (VISTARIL) 25 MG capsule Take 25 mg by mouth 3 (three) times daily as needed.   lisdexamfetamine (VYVANSE) 30 MG capsule Take 30 mg by mouth every morning.   lisinopril (ZESTRIL) 40 MG tablet Take 40 mg by mouth daily.   metoprolol tartrate (LOPRESSOR) 50 MG tablet Take 50 mg by mouth daily.   MOUNJARO 2.5 MG/0.5ML Pen Inject into the skin.   nystatin cream  (MYCOSTATIN) Apply topically 2 (two) times daily.   Omega-3 Fatty Acids (OMEGA-3 FISH OIL PO) Take 1 capsule by mouth daily.   omeprazole (PRILOSEC) 20 MG capsule Take 20 mg by mouth daily.   sildenafil (VIAGRA) 100 MG tablet SMARTSIG:1 Tablet(s) By Mouth   spironolactone (ALDACTONE) 25 MG tablet Take 12.5 mg by mouth daily.   testosterone cypionate (DEPOTESTOSTERONE CYPIONATE) 200 MG/ML injection SMARTSIG:0.5 Milliliter(s) IM Once a Week   venlafaxine XR (EFFEXOR-XR) 150 MG 24 hr capsule Take by mouth daily with breakfast. Patient taking twice daily, 300mg  daily.   [DISCONTINUED] amLODipine (NORVASC) 5 MG tablet TAKE ONE TABLET BY MOUTH TWICE DAILY. (Patient taking differently: daily.)   [DISCONTINUED] amphetamine-dextroamphetamine (ADDERALL XR) 30 MG 24 hr capsule Take 30 mg by mouth daily.   [DISCONTINUED] metoprolol succinate (TOPROL-XL) 25 MG 24 hr tablet Take 25 mg by mouth daily.   [DISCONTINUED] PREVNAR 20 0.5 ML injection    [DISCONTINUED] SHINGRIX injection    [DISCONTINUED] tamsulosin (FLOMAX) 0.4 MG CAPS capsule Take 0.4 mg by mouth daily.   [DISCONTINUED] testosterone cypionate (DEPOTESTOTERONE CYPIONATE) 100 MG/ML injection Inject 200 mg into the muscle every 14 (fourteen) days. For IM use only.   fluticasone (FLONASE) 50 MCG/ACT nasal spray Place 1 spray into both nostrils daily for 7 days.   tamsulosin (FLOMAX) 0.4 MG CAPS capsule Flomax   [DISCONTINUED] albuterol (VENTOLIN HFA) 108 (90 Base) MCG/ACT inhaler Inhale 1-2  puffs into the lungs every 6 (six) hours as needed for wheezing or shortness of breath. (Patient not taking: Reported on 02/03/2023)   [DISCONTINUED] Chlorpheniramine Maleate (ALLERGY PO) Take 1 tablet by mouth daily as needed (allergies).   [DISCONTINUED] metoprolol tartrate (LOPRESSOR) 25 MG tablet Take 25 mg by mouth 2 (two) times daily.   No facility-administered encounter medications on file as of 02/03/2023.    Past Surgical History:  Procedure Laterality  Date   GASTRIC BYPASS      Review of Systems  Constitutional:  Negative for chills and fever.  Eyes:  Negative for blurred vision.  Respiratory:  Negative for shortness of breath.   Cardiovascular:  Negative for chest pain.  Gastrointestinal:  Negative for abdominal pain.  Genitourinary:  Negative for dysuria.  Skin:  Positive for rash.  Neurological:  Negative for dizziness and headaches.      Objective    BP (!) 150/109   Pulse 96   Ht 6' (1.829 m)   Wt (!) 369 lb (167.4 kg)   SpO2 92%   BMI 50.05 kg/m   Physical Exam Vitals reviewed.  Constitutional:      General: He is not in acute distress.    Appearance: Normal appearance. He is obese. He is not ill-appearing, toxic-appearing or diaphoretic.  HENT:     Head: Normocephalic.  Eyes:     General:        Right eye: No discharge.        Left eye: No discharge.     Conjunctiva/sclera: Conjunctivae normal.  Cardiovascular:     Rate and Rhythm: Normal rate.     Pulses: Normal pulses.     Heart sounds: Normal heart sounds.  Pulmonary:     Effort: Pulmonary effort is normal. No respiratory distress.     Breath sounds: Normal breath sounds.  Abdominal:     General: Bowel sounds are normal.     Palpations: Abdomen is soft.     Tenderness: There is no abdominal tenderness. There is no right CVA tenderness, left CVA tenderness or guarding.  Musculoskeletal:        General: Normal range of motion.     Cervical back: Normal range of motion.  Skin:    General: Skin is warm and dry.     Capillary Refill: Capillary refill takes less than 2 seconds.  Neurological:     Mental Status: He is alert.     Coordination: Coordination normal.     Gait: Gait normal.  Psychiatric:        Mood and Affect: Mood normal.        Behavior: Behavior normal.       Assessment & Plan:  Primary hypertension Assessment & Plan: Vitals:   02/03/23 0940 02/03/23 0951  BP: (!) 158/92 (!) 150/109  Blood pressure not controlled first visit,  Labs ordered  Follow up in 1 week for via mychart at home blood pressure readings Patient reports taking Amlodipine 10 mg, Lisinopril 40 once daily, Metoprolol 50 mg once daily, Aldactone 12.5 mg Explained non pharmacological interventions such as low salt, DASH diet discussed. Educated on stress reduction and physical activity minimum 150 minutes per week. Discussed signs and symptoms of major cardiovascular event and need to present to the ED. Patient verbalizes understanding regarding plan of care and all questions answered.   Orders: -     CBC with Differential/Platelet -     CMP14+EGFR -     Lipid panel  Benign prostatic  hyperplasia, unspecified whether lower urinary tract symptoms present -     PSA  Screening for colon cancer -     Cologuard  Hypothyroidism, unspecified type -     TSH + free T4  Vitamin D deficiency -     VITAMIN D 25 Hydroxy (Vit-D Deficiency, Fractures)  Butterfly rash -     ANA w/Reflex  Need for hepatitis C screening test -     Hepatitis C antibody  Screening for HIV (human immunodeficiency virus) -     HIV Antibody (routine testing w rflx)  IFG (impaired fasting glucose) -     Hemoglobin A1c -     Microalbumin / creatinine urine ratio  OSA (obstructive sleep apnea) Assessment & Plan: Stop Bang Score 5 Referral placed to sleep studies   Orders: -     Ambulatory referral to Sleep Studies  Sleep apnea in adult    Return in about 4 months (around 06/06/2023), or if symptoms worsen or fail to improve.   Cruzita Lederer Newman Nip, FNP

## 2023-02-03 NOTE — Patient Instructions (Signed)
        Great to see you today.  I have refilled the medication(s) we provide.   Follow up in 1 week with at home blood pressure readings via mychart   If labs were collected, we will inform you of lab results once received either by echart message or telephone call.   - echart message- for normal results that have been seen by the patient already.   - telephone call: abnormal results or if patient has not viewed results in their echart.   - Please take medications as prescribed. - Follow up with your primary health provider if any health concerns arises. - If symptoms worsen please contact your primary care provider and/or visit the emergency department.

## 2023-02-03 NOTE — Assessment & Plan Note (Signed)
Stop Bang Score 5 Referral placed to sleep studies

## 2023-02-03 NOTE — Assessment & Plan Note (Addendum)
Vitals:   02/03/23 0940 02/03/23 0951  BP: (!) 158/92 (!) 150/109  Blood pressure not controlled first visit, Labs ordered  Follow up in 1 week for via mychart at home blood pressure readings Patient reports taking Amlodipine 10 mg, Lisinopril 40 once daily, Metoprolol 50 mg once daily, Aldactone 12.5 mg Explained non pharmacological interventions such as low salt, DASH diet discussed. Educated on stress reduction and physical activity minimum 150 minutes per week. Discussed signs and symptoms of major cardiovascular event and need to present to the ED. Patient verbalizes understanding regarding plan of care and all questions answered.

## 2023-02-04 ENCOUNTER — Other Ambulatory Visit: Payer: Self-pay | Admitting: Family Medicine

## 2023-02-04 MED ORDER — ROSUVASTATIN CALCIUM 40 MG PO TABS: 40.0000 mg | ORAL_TABLET | Freq: Every day | ORAL | 3 refills | Status: DC

## 2023-02-05 ENCOUNTER — Encounter: Payer: Self-pay | Admitting: Family Medicine

## 2023-02-05 LAB — VITAMIN D 25 HYDROXY (VIT D DEFICIENCY, FRACTURES): Vit D, 25-Hydroxy: 26.8 ng/mL — ABNORMAL LOW (ref 30.0–100.0)

## 2023-02-05 LAB — HEMOGLOBIN A1C
Est. average glucose Bld gHb Est-mCnc: 131 mg/dL
Hgb A1c MFr Bld: 6.2 % — ABNORMAL HIGH (ref 4.8–5.6)

## 2023-02-05 LAB — LIPID PANEL
Chol/HDL Ratio: 4.6 {ratio} (ref 0.0–5.0)
Cholesterol, Total: 180 mg/dL (ref 100–199)
HDL: 39 mg/dL — ABNORMAL LOW (ref >39)
LDL Chol Calc (NIH): 112 mg/dL — ABNORMAL HIGH (ref 0–99)
Triglycerides: 161 mg/dL — ABNORMAL HIGH (ref 0–149)
VLDL Cholesterol Cal: 29 mg/dL (ref 5–40)

## 2023-02-05 LAB — CMP14+EGFR
ALT: 43 [IU]/L (ref 0–44)
AST: 34 [IU]/L (ref 0–40)
Albumin: 4.1 g/dL (ref 3.8–4.9)
Alkaline Phosphatase: 79 [IU]/L (ref 44–121)
BUN/Creatinine Ratio: 11 (ref 9–20)
BUN: 12 mg/dL (ref 6–24)
Bilirubin Total: 0.4 mg/dL (ref 0.0–1.2)
CO2: 26 mmol/L (ref 20–29)
Calcium: 9.4 mg/dL (ref 8.7–10.2)
Chloride: 102 mmol/L (ref 96–106)
Creatinine, Ser: 1.08 mg/dL (ref 0.76–1.27)
Globulin, Total: 3.1 g/dL (ref 1.5–4.5)
Glucose: 87 mg/dL (ref 70–99)
Potassium: 3.7 mmol/L (ref 3.5–5.2)
Sodium: 141 mmol/L (ref 134–144)
Total Protein: 7.2 g/dL (ref 6.0–8.5)
eGFR: 83 mL/min/{1.73_m2} (ref >59)

## 2023-02-05 LAB — MICROALBUMIN / CREATININE URINE RATIO
Creatinine, Urine: 88.3 mg/dL
Microalb/Creat Ratio: 318 mg/g{creat} — ABNORMAL HIGH (ref 0–29)
Microalbumin, Urine: 281.1 ug/mL

## 2023-02-05 LAB — CBC WITH DIFFERENTIAL/PLATELET
Basophils Absolute: 0 10*3/uL (ref 0.0–0.2)
Basos: 1 %
EOS (ABSOLUTE): 0.4 10*3/uL (ref 0.0–0.4)
Eos: 5 %
Hematocrit: 46.3 % (ref 37.5–51.0)
Hemoglobin: 14.3 g/dL (ref 13.0–17.7)
Immature Grans (Abs): 0 10*3/uL (ref 0.0–0.1)
Immature Granulocytes: 0 %
Lymphocytes Absolute: 3.1 10*3/uL (ref 0.7–3.1)
Lymphs: 39 %
MCH: 26 pg — ABNORMAL LOW (ref 26.6–33.0)
MCHC: 30.9 g/dL — ABNORMAL LOW (ref 31.5–35.7)
MCV: 84 fL (ref 79–97)
Monocytes Absolute: 0.8 10*3/uL (ref 0.1–0.9)
Monocytes: 10 %
Neutrophils Absolute: 3.6 10*3/uL (ref 1.4–7.0)
Neutrophils: 45 %
Platelets: 294 10*3/uL (ref 150–450)
RBC: 5.5 x10E6/uL (ref 4.14–5.80)
RDW: 19.3 % — ABNORMAL HIGH (ref 11.6–15.4)
WBC: 7.9 10*3/uL (ref 3.4–10.8)

## 2023-02-05 LAB — HEPATITIS C ANTIBODY: Hep C Virus Ab: NONREACTIVE

## 2023-02-05 LAB — HIV ANTIBODY (ROUTINE TESTING W REFLEX): HIV Screen 4th Generation wRfx: NONREACTIVE

## 2023-02-05 LAB — PSA: Prostate Specific Ag, Serum: 0.2 ng/mL (ref 0.0–4.0)

## 2023-02-05 LAB — ANA W/REFLEX

## 2023-02-05 LAB — TSH+FREE T4
Free T4: 1.26 ng/dL (ref 0.82–1.77)
TSH: 0.939 u[IU]/mL (ref 0.450–4.500)

## 2023-02-06 ENCOUNTER — Other Ambulatory Visit: Payer: Self-pay | Admitting: Family Medicine

## 2023-02-06 MED ORDER — EZETIMIBE 10 MG PO TABS
10.0000 mg | ORAL_TABLET | Freq: Every day | ORAL | 2 refills | Status: DC
Start: 2023-02-06 — End: 2023-06-11

## 2023-03-05 LAB — COLOGUARD: COLOGUARD: NEGATIVE

## 2023-03-11 ENCOUNTER — Other Ambulatory Visit: Payer: Self-pay | Admitting: Family Medicine

## 2023-03-11 ENCOUNTER — Telehealth: Payer: Self-pay | Admitting: Family Medicine

## 2023-03-11 MED ORDER — TIRZEPATIDE 5 MG/0.5ML ~~LOC~~ SOAJ: 5.0000 mg | SUBCUTANEOUS | 0 refills | Status: DC

## 2023-03-11 NOTE — Progress Notes (Unsigned)
   Established Patient Office Visit   Subjective  Patient ID: Julian Richardson, male    DOB: 06/05/1970  Age: 52 y.o. MRN: [id number]  No chief complaint on file.   He  has a past medical history of ADHD (attention deficit hyperactivity disorder), Gout, Hypertension, and Sleep apnea.  HPI  ROS    Objective:     There were no vitals taken for this visit. {Vitals History (Optional):23777}  Physical Exam   No results found for any visits on 03/12/23.  The 10-year ASCVD risk score (Arnett DK, et al., 2019) is: 22.9%    Assessment & Plan:  There are no diagnoses linked to this encounter.  No follow-ups on file.   Cruzita Lederer Newman Nip, FNP

## 2023-03-11 NOTE — Patient Instructions (Signed)
        Great to see you today.  I have refilled the medication(s) we provide.    Dermatology # [phone]      - Please take medications as prescribed. - Follow up with your primary health provider if any health concerns arises. - If symptoms worsen please contact your primary care provider and/or visit the emergency department.

## 2023-03-11 NOTE — Telephone Encounter (Signed)
Patient called need refill on next dosage  [facility] [[phone]]   Pharmacy: [facility]

## 2023-03-11 NOTE — Telephone Encounter (Signed)
Yes will send

## 2023-03-12 ENCOUNTER — Encounter: Payer: Self-pay | Admitting: Family Medicine

## 2023-03-12 ENCOUNTER — Ambulatory Visit (INDEPENDENT_AMBULATORY_CARE_PROVIDER_SITE_OTHER): Payer: Medicare HMO | Admitting: Family Medicine

## 2023-03-12 VITALS — BP 160/95 | HR 91 | Ht 73.0 in | Wt 358.1 lb

## 2023-03-12 DIAGNOSIS — I10 Essential (primary) hypertension: Secondary | ICD-10-CM

## 2023-03-12 DIAGNOSIS — Z789 Other specified health status: Secondary | ICD-10-CM | POA: Diagnosis not present

## 2023-03-12 MED ORDER — CLONAZEPAM 0.5 MG PO TABS: 0.5000 mg | ORAL_TABLET | Freq: Once | ORAL | 0 refills | Status: DC | PRN

## 2023-03-12 MED ORDER — METOPROLOL SUCCINATE ER 100 MG PO TB24: 100.0000 mg | ORAL_TABLET | Freq: Every day | ORAL | 3 refills | Status: DC

## 2023-03-12 MED ORDER — TIRZEPATIDE 5 MG/0.5ML ~~LOC~~ SOAJ: 5.0000 mg | SUBCUTANEOUS | 0 refills | Status: DC

## 2023-03-12 NOTE — Telephone Encounter (Signed)
Thank you .he is in office today.

## 2023-03-12 NOTE — Assessment & Plan Note (Addendum)
Vitals:   03/12/23 1413 03/12/23 1424  BP: (!) 162/90 (!) 160/95  At home blood pressure readings:191/69, 186/74, 174/79, 189/79, 184/89, 190/84 Today's visit, the patient's metoprolol therapy was adjusted. The previous prescription of metoprolol tartrate 50 mg was discontinued, and the patient has been transitioned to metoprolol succinate 100 mg once daily. Continue  Amlodipine 10 mg, Lisinopril 40 once daily,  Aldactone 12.5 mg daily Referral placed to Cardiology upon patient request

## 2023-03-18 ENCOUNTER — Ambulatory Visit: Payer: Medicare HMO | Admitting: Family Medicine

## 2023-03-31 ENCOUNTER — Encounter: Payer: Self-pay | Admitting: Family Medicine

## 2023-03-31 NOTE — Telephone Encounter (Signed)
Please inform patient, Wellbutrin can make metoprolol medication work a little stronger than usual. This means you might notice side effects like feeling extra tired, dizzy, or having a slower heartbeat.   Other option is Buspar Minimal impact on sexual function.  Please let me know if interested

## 2023-04-01 ENCOUNTER — Other Ambulatory Visit: Payer: Self-pay | Admitting: Family Medicine

## 2023-04-01 MED ORDER — BUSPIRONE HCL 15 MG PO TABS: 15.0000 mg | ORAL_TABLET | Freq: Two times a day (BID) | ORAL | 3 refills | Status: DC

## 2023-04-01 NOTE — Telephone Encounter (Signed)
Sent!

## 2023-04-12 ENCOUNTER — Encounter: Payer: Self-pay | Admitting: Family Medicine

## 2023-04-13 ENCOUNTER — Other Ambulatory Visit: Payer: Self-pay | Admitting: Family Medicine

## 2023-04-13 ENCOUNTER — Encounter: Payer: Self-pay | Admitting: Family Medicine

## 2023-04-13 MED ORDER — AMOXICILLIN 500 MG PO CAPS: 500.0000 mg | ORAL_CAPSULE | Freq: Three times a day (TID) | ORAL | 0 refills | Status: AC

## 2023-04-13 NOTE — Telephone Encounter (Signed)
sent 

## 2023-04-14 ENCOUNTER — Encounter: Payer: Self-pay | Admitting: Family Medicine

## 2023-04-14 ENCOUNTER — Other Ambulatory Visit: Payer: Self-pay | Admitting: Family Medicine

## 2023-04-14 MED ORDER — HYDROCORTISONE 1 % EX LOTN: 1.0000 | TOPICAL_LOTION | Freq: Two times a day (BID) | CUTANEOUS | 0 refills | Status: DC

## 2023-04-14 NOTE — Telephone Encounter (Signed)
Call Dermatology [facility]# [phone]  or [facility] #[phone]  I will send in Hydrocortisone 1% cream to his pharmacy apply twice daily to face for 10 days

## 2023-04-15 ENCOUNTER — Other Ambulatory Visit: Payer: Self-pay | Admitting: Family Medicine

## 2023-04-15 ENCOUNTER — Telehealth: Payer: Self-pay | Admitting: Family Medicine

## 2023-04-15 ENCOUNTER — Encounter: Payer: Self-pay | Admitting: Family Medicine

## 2023-04-15 NOTE — Telephone Encounter (Signed)
Spoke with patient.

## 2023-04-15 NOTE — Telephone Encounter (Signed)
The percentage I sent it used for the face only

## 2023-04-15 NOTE — Telephone Encounter (Signed)
Copied from CRM [id number]. Topic: General - Other >> Apr 15, 2023  9:19 AM Carlatta H wrote:   Reason for CRM: Hydrocortison 1% is overthe countert//If it needs to be covered by insurance please send Script for 2.5%//[employer] [phone]

## 2023-04-16 ENCOUNTER — Encounter: Payer: Self-pay | Admitting: Family Medicine

## 2023-04-16 ENCOUNTER — Other Ambulatory Visit: Payer: Self-pay | Admitting: Family Medicine

## 2023-04-16 NOTE — Telephone Encounter (Signed)
 Office visit or urgent care

## 2023-04-17 ENCOUNTER — Ambulatory Visit (INDEPENDENT_AMBULATORY_CARE_PROVIDER_SITE_OTHER): Payer: Medicare HMO | Admitting: Family Medicine

## 2023-04-17 ENCOUNTER — Encounter: Payer: Self-pay | Admitting: Family Medicine

## 2023-04-17 VITALS — BP 178/103 | HR 84 | Ht 73.0 in | Wt 350.1 lb

## 2023-04-17 DIAGNOSIS — K1379 Other lesions of oral mucosa: Secondary | ICD-10-CM | POA: Diagnosis not present

## 2023-04-17 DIAGNOSIS — K0889 Other specified disorders of teeth and supporting structures: Secondary | ICD-10-CM | POA: Diagnosis not present

## 2023-04-17 MED ORDER — NYSTATIN 100000 UNIT/GM EX CREA: TOPICAL_CREAM | Freq: Two times a day (BID) | CUTANEOUS | 2 refills | Status: DC

## 2023-04-17 MED ORDER — CLONAZEPAM 0.5 MG PO TABS: 0.5000 mg | ORAL_TABLET | Freq: Once | ORAL | 0 refills | Status: DC | PRN

## 2023-04-17 MED ORDER — LIDOCAINE VISCOUS HCL 2 % MT SOLN: 15.0000 mL | OROMUCOSAL | 0 refills | Status: DC | PRN

## 2023-04-17 MED ORDER — METHYLPREDNISOLONE ACETATE 40 MG/ML IJ SUSP: 40.0000 mg | Freq: Once | INTRAMUSCULAR | Status: AC

## 2023-04-17 MED ORDER — METHYLPREDNISOLONE ACETATE 80 MG/ML IJ SUSP
40.0000 mg | Freq: Once | INTRAMUSCULAR | Status: AC
  Administered 2023-04-17: 40 mg via INTRAMUSCULAR

## 2023-04-17 MED ORDER — AMOXICILLIN 500 MG PO CAPS: 500.0000 mg | ORAL_CAPSULE | Freq: Two times a day (BID) | ORAL | 0 refills | Status: AC

## 2023-04-17 NOTE — Patient Instructions (Addendum)
        Great to see you today.  I have refilled the medication(s) we provide.    Dermatology [facility] # [phone]   [place]  [phone]    If labs were collected, we will inform you of lab results once received either by echart message or telephone call.   - echart message- for normal results that have been seen by the patient already.   - telephone call: abnormal results or if patient has not viewed results in their echart.   - Please take medications as prescribed. - Follow up with your primary health provider if any health concerns arises. - If symptoms worsen please contact your primary care provider and/or visit the emergency department.

## 2023-04-17 NOTE — Telephone Encounter (Signed)
He understood he was grateful for the refills and will look for the 2.5 otc.

## 2023-04-17 NOTE — Telephone Encounter (Signed)
Spoke to pt. He will try to schedule a virtual visit, with [place] before trying UC.

## 2023-04-17 NOTE — Assessment & Plan Note (Addendum)
Advise patient to follow up with Dentist Discussed to maintain good oral hygiene by brushing gently with a soft-bristled toothbrush and rinsing with warm saltwater to reduce irritation. Apply a cold compress to the outside of the cheek to help alleviate swelling and discomfort. Avoid hard, spicy, or acidic foods that may worsen the pain. Depo- Medrol IM injection given today. Amoxicillin 500 mg twice daily x 5 days Lidocaine solution PRN for mouth pain

## 2023-04-17 NOTE — Progress Notes (Signed)
Established Patient Office Visit   Subjective  Patient ID: Julian Richardson, male    DOB: 07-Oct-1970  Age: 52 y.o. MRN: [id number]  Chief Complaint  Patient presents with   Acute Visit    Pain medication and antibiotic for tooth pain.     He  has a past medical history of ADHD (attention deficit hyperactivity disorder), Anxiety, Gout, Hypertension, and Sleep apnea.  Patient reports recurrent oral pain that is constant and has been gradually worsening over time. The current pain severity is rated as 9/10. Associated symptoms include facial pain, tooth pain, oral bleeding, and sinus pressure. The patient also describes shooting pain and a gnawing sensation. Previous attempts to manage the symptoms with rest and acetaminophen provided no relief.    Review of Systems  Constitutional:  Negative for chills and fever.  HENT:  Positive for sinus pain and sore throat.   Respiratory:  Negative for shortness of breath.   Cardiovascular:  Negative for chest pain.  Neurological:  Positive for headaches.      Objective:     BP (!) 178/103   Pulse 84   Ht 6\' 1"  (1.854 m)   Wt (!) 350 lb 1.3 oz (158.8 kg)   SpO2 94%   BMI 46.19 kg/m  BP Readings from Last 3 Encounters:  04/17/23 (!) 178/103  03/12/23 (!) 160/95  02/03/23 (!) 150/109      Physical Exam Vitals reviewed.  Constitutional:      General: He is not in acute distress.    Appearance: Normal appearance. He is not ill-appearing, toxic-appearing or diaphoretic.  HENT:     Head: Normocephalic.     Mouth/Throat:     Dentition: Dental tenderness present.     Pharynx: Posterior oropharyngeal erythema present.  Eyes:     General:        Right eye: No discharge.        Left eye: No discharge.     Conjunctiva/sclera: Conjunctivae normal.  Cardiovascular:     Rate and Rhythm: Normal rate.     Pulses: Normal pulses.     Heart sounds: Normal heart sounds.  Pulmonary:     Effort: Pulmonary effort is normal. No respiratory  distress.     Breath sounds: Normal breath sounds.  Skin:    General: Skin is warm and dry.     Capillary Refill: Capillary refill takes less than 2 seconds.  Neurological:     Mental Status: He is alert.  Psychiatric:        Mood and Affect: Mood normal.        Behavior: Behavior normal.      No results found for any visits on 04/17/23.  The 10-year ASCVD risk score (Arnett DK, et al., 2019) is: 30.3%    Assessment & Plan:  Mouth pain -     methylPREDNISolone Acetate -     Amoxicillin; Take 1 capsule (500 mg total) by mouth 2 (two) times daily for 5 days.  Dispense: 10 capsule; Refill: 0 -     Lidocaine Viscous HCl; Use as directed 15 mLs in the mouth or throat as needed.  Dispense: 100 mL; Refill: 0 -     methylPREDNISolone Acetate  Tooth pain Assessment & Plan: Advise patient to follow up with Dentist Discussed to maintain good oral hygiene by brushing gently with a soft-bristled toothbrush and rinsing with warm saltwater to reduce irritation. Apply a cold compress to the outside of the cheek to help alleviate swelling and  discomfort. Avoid hard, spicy, or acidic foods that may worsen the pain. Depo- Medrol IM injection given today. Amoxicillin 500 mg twice daily x 5 days Lidocaine solution PRN for mouth pain   Other orders -     clonazePAM; Take 1 tablet (0.5 mg total) by mouth once as needed for up to 1 dose for anxiety.  Dispense: 10 tablet; Refill: 0    Return if symptoms worsen or fail to improve.   Cruzita Lederer Newman Nip, FNP

## 2023-04-21 ENCOUNTER — Encounter: Payer: Self-pay | Admitting: Family Medicine

## 2023-04-23 ENCOUNTER — Other Ambulatory Visit: Payer: Self-pay | Admitting: Family Medicine

## 2023-04-23 MED ORDER — DULOXETINE HCL 30 MG PO CPEP: 30.0000 mg | ORAL_CAPSULE | Freq: Every day | ORAL | 0 refills | Status: DC

## 2023-04-23 NOTE — Telephone Encounter (Signed)
Sent in Cymbalta

## 2023-04-24 ENCOUNTER — Other Ambulatory Visit: Payer: Self-pay | Admitting: Family Medicine

## 2023-04-24 ENCOUNTER — Telehealth: Payer: Self-pay | Admitting: Family Medicine

## 2023-04-24 MED ORDER — BENZOCAINE 10 % MT GEL: 1.0000 | OROMUCOSAL | 1 refills | Status: DC | PRN

## 2023-04-24 NOTE — Telephone Encounter (Signed)
Pt has been informed.

## 2023-04-24 NOTE — Telephone Encounter (Signed)
Disregard

## 2023-04-25 ENCOUNTER — Encounter: Payer: Self-pay | Admitting: Family Medicine

## 2023-04-27 ENCOUNTER — Other Ambulatory Visit: Payer: Self-pay | Admitting: Family Medicine

## 2023-04-27 ENCOUNTER — Encounter: Payer: Self-pay | Admitting: Family Medicine

## 2023-04-27 DIAGNOSIS — F419 Anxiety disorder, unspecified: Secondary | ICD-10-CM

## 2023-04-27 NOTE — Telephone Encounter (Signed)
Again,Unfortunately, I am unable to prescribe Provigil (modafinil) at this time. This medication requires a thorough evaluation by a psychiatrist or sleep specialist to ensure it is safe and appropriate for your specific needs.

## 2023-04-27 NOTE — Telephone Encounter (Signed)
Please inform patient:  I've placed a referral for psychiatry, and I recommend following up with Dr. Glynis Smiles office to confirm your appointment.  Unfortunately, I'm unable to prescribe Provigil, as this requires evaluation by a psychiatrist or sleep therapist. If you're experiencing significant difficulties, consider contacting Dr. Glynis Smiles office to discuss interim options.

## 2023-04-29 ENCOUNTER — Other Ambulatory Visit: Payer: Self-pay | Admitting: Family Medicine

## 2023-04-30 ENCOUNTER — Other Ambulatory Visit: Payer: Self-pay | Admitting: Family Medicine

## 2023-04-30 NOTE — Telephone Encounter (Signed)
  Please inform the patient that Adderall 10 mg was filled on 04/05/2023 by psychiatrist Mojeed A Akintayo, MD,  Vyvanse and Adderall should not be taken together due to the risk of serious side effects. Please follow up with your psychiatrist for further ADHD management and medication adjustments. I will not send this prescription

## 2023-04-30 NOTE — Telephone Encounter (Signed)
 Hi whose phone number is this

## 2023-05-02 ENCOUNTER — Encounter: Payer: Self-pay | Admitting: Family Medicine

## 2023-05-04 NOTE — Telephone Encounter (Signed)
 Pt. Was informed before the weekend of the reasoning and need for the psychiatrist. He stated he understood and was thankful for your care.

## 2023-05-04 NOTE — Telephone Encounter (Signed)
 Please inform patient,  You would need to see a psychiatrist to evaluate and prescribe medication for your sleep disorder. Psychiatrists are specialists in managing certain conditions that affect sleep and can assess whether medication is appropriate for your needs.  This doesn't mean there's anything wrong with you mentally-it's just that some sleep-related medications require evaluation by a specialist in this area to ensure the safest and most effective care.

## 2023-05-05 ENCOUNTER — Telehealth: Payer: Self-pay

## 2023-05-05 ENCOUNTER — Other Ambulatory Visit: Payer: Self-pay

## 2023-05-05 ENCOUNTER — Other Ambulatory Visit: Payer: Self-pay | Admitting: Family Medicine

## 2023-05-05 MED ORDER — HYDROCORTISONE 1 % EX LOTN: 1.0000 | TOPICAL_LOTION | Freq: Two times a day (BID) | CUTANEOUS | 0 refills | Status: DC

## 2023-05-05 NOTE — Telephone Encounter (Signed)
 sent

## 2023-05-05 NOTE — Telephone Encounter (Signed)
 Copied from CRM [id number]. Topic: Clinical - Medication Refill >> Apr 15, 2023 12:59 PM Powell HERO wrote: Most Recent Primary Care Visit:  Provider: TERRY WILHELMENA LLOYD HILARIO  Department: [facility]  Visit Type: OFFICE VISIT  Date: 03/12/2023  Medication: hydrocortisone  1 % lotion  Has the patient contacted their pharmacy? Yes The pharmacy informed the patient that this is not covered by his insurance and suggested he get a RX for 2.5 option that is not over the counter so his insurance will pay for it  Is this the correct pharmacy for this prescription? Yes If no, delete pharmacy and type the correct one.  This is the patient's preferred pharmacy:  [facility] - [place], [place] - [address] [place] [address] Phone: [phone] Fax: [phone]     Has the prescription been filled recently? No  Is the patient out of the medication? Yes  Has the patient been seen for an appointment in the last year OR does the patient have an upcoming appointment? Yes  Can we respond through MyChart? Phone or Mychart  Agent: Please be advised that Rx refills may take up to 3 business days. We ask that you follow-up with your pharmacy. >> Apr 15, 2023  4:01 PM Nurse Autumn B wrote: Provider and patient not at this office

## 2023-05-12 NOTE — Telephone Encounter (Signed)
 Hello please inform patient  Needs office visit

## 2023-05-14 ENCOUNTER — Other Ambulatory Visit: Payer: Self-pay | Admitting: Family Medicine

## 2023-05-14 ENCOUNTER — Telehealth: Payer: 59 | Admitting: Physician Assistant

## 2023-05-14 DIAGNOSIS — K047 Periapical abscess without sinus: Secondary | ICD-10-CM | POA: Diagnosis not present

## 2023-05-14 MED ORDER — AMOXICILLIN-POT CLAVULANATE 875-125 MG PO TABS: 1.0000 | ORAL_TABLET | Freq: Two times a day (BID) | ORAL | 0 refills | Status: DC

## 2023-05-14 NOTE — Telephone Encounter (Signed)
  This is a monthly prescription that was refilled just three weeks ago. The patient will need to wait until the four-week mark before it can be refilled again.

## 2023-05-14 NOTE — Patient Instructions (Addendum)
Julian Richardson, thank you for joining Piedad Climes, PA-C for today's virtual visit.  While this provider is not your primary care provider (PCP), if your PCP is located in our provider database this encounter information will be shared with them immediately following your visit.   A [facility] MyChart account gives you access to today's visit and all your visits, tests, and labs performed at [facility] " click here if you don't have a [facility] MyChart account or go to [email or web address]  Consent: (Patient) Julian Richardson provided verbal consent for this virtual visit at the beginning of the encounter.  Current Medications:  Current Outpatient Medications:    acetaminophen (TYLENOL) 500 MG tablet, Take 1,000 mg by mouth every 6 (six) hours as needed for mild pain., Disp: , Rfl:    allopurinol (ZYLOPRIM) 100 MG tablet, Take 100 mg by mouth daily., Disp: , Rfl:    amLODipine (NORVASC) 10 MG tablet, Take 10 mg by mouth daily., Disp: , Rfl:    amphetamine-dextroamphetamine (ADDERALL) 10 MG tablet, Take 10 mg by mouth daily with breakfast., Disp: , Rfl:    benzocaine (ORAJEL) 10 % mucosal gel, Use as directed 1 Application in the mouth or throat as needed for mouth pain., Disp: 9 g, Rfl: 1   BOOSTRIX 5-2.5-18.5 LF-MCG/0.5 injection, , Disp: , Rfl:    busPIRone (BUSPAR) 15 MG tablet, Take 1 tablet (15 mg total) by mouth 2 (two) times daily., Disp: 60 tablet, Rfl: 3   Cholecalciferol (VITAMIN D-3 PO), Take 1 tablet by mouth daily., Disp: , Rfl:    clonazePAM (KLONOPIN) 0.5 MG tablet, Take 1 tablet (0.5 mg total) by mouth once as needed for up to 1 dose for anxiety., Disp: 10 tablet, Rfl: 0   Coenzyme Q10 (COQ10 PO), Take 1 tablet by mouth daily., Disp: , Rfl:    colchicine 0.6 MG tablet, Take by mouth as needed., Disp: , Rfl:    DULoxetine (CYMBALTA) 30 MG capsule, Take 1 capsule (30 mg total) by mouth daily., Disp: 10 capsule, Rfl: 0   ezetimibe (ZETIA) 10 MG tablet, Take 1  tablet (10 mg total) by mouth daily., Disp: 30 tablet, Rfl: 2   gabapentin (NEURONTIN) 400 MG capsule, Take 800 mg by mouth at bedtime., Disp: , Rfl:    Ginkgo Biloba 40 MG TABS, Take 1 tablet by mouth daily., Disp: , Rfl:    hydrocortisone 1 % lotion, Apply 1 Application topically 2 (two) times daily., Disp: 118 mL, Rfl: 0   hydrOXYzine (VISTARIL) 25 MG capsule, Take 25 mg by mouth 3 (three) times daily as needed., Disp: , Rfl:    lidocaine (XYLOCAINE) 2 % solution, Use as directed 15 mLs in the mouth or throat as needed., Disp: 100 mL, Rfl: 0   lisdexamfetamine (VYVANSE) 30 MG capsule, Take 30 mg by mouth every morning., Disp: , Rfl:    lisinopril (ZESTRIL) 40 MG tablet, Take 40 mg by mouth daily., Disp: , Rfl:    metoprolol succinate (TOPROL-XL) 100 MG 24 hr tablet, Take 1 tablet (100 mg total) by mouth daily. Take with or immediately following a meal., Disp: 90 tablet, Rfl: 3   MOUNJARO 2.5 MG/0.5ML Pen, Inject 2.5 mg into the skin once a week., Disp: , Rfl:    nystatin cream (MYCOSTATIN), Apply topically 2 (two) times daily., Disp: 30 g, Rfl: 2   Omega-3 Fatty Acids (OMEGA-3 FISH OIL PO), Take 1 capsule by mouth daily., Disp: , Rfl:    omeprazole (PRILOSEC) 20 MG capsule,  Take 20 mg by mouth daily., Disp: , Rfl:    rosuvastatin (CRESTOR) 40 MG tablet, Take 1 tablet (40 mg total) by mouth daily., Disp: 90 tablet, Rfl: 3   spironolactone (ALDACTONE) 25 MG tablet, Take 12.5 mg by mouth daily., Disp: , Rfl:    tadalafil (CIALIS) 20 MG tablet, Take 20 mg by mouth as needed., Disp: , Rfl:    tamsulosin (FLOMAX) 0.4 MG CAPS capsule, , Disp: , Rfl:    testosterone cypionate (DEPOTESTOSTERONE CYPIONATE) 200 MG/ML injection, SMARTSIG:0.5 Milliliter(s) IM Once a Week, Disp: , Rfl:    tirzepatide (MOUNJARO) 5 MG/0.5ML Pen, Inject 5 mg into the skin once a week., Disp: 2 mL, Rfl: 0   venlafaxine XR (EFFEXOR-XR) 150 MG 24 hr capsule, Take by mouth daily with breakfast. Patient taking twice daily, 300mg   daily., Disp: , Rfl:    venlafaxine XR (EFFEXOR-XR) 75 MG 24 hr capsule, Take 75 mg by mouth every morning., Disp: , Rfl:   Current Facility-Administered Medications:    methylPREDNISolone acetate (DEPO-MEDROL) injection 40 mg, 40 mg, Intramuscular, Once,    Medications ordered in this encounter:  No orders of the defined types were placed in this encounter.    *If you need refills on other medications prior to your next appointment, please contact your pharmacy*  Follow-Up: Call back or seek an in-person evaluation if the symptoms worsen or if the condition fails to improve as anticipated.  [facility] [phone]  Other Instructions Dental Abscess  A dental abscess is an area of pus in or around a tooth. It comes from an infection. It can cause pain and other symptoms. Treatment will help with symptoms and prevent the infection from spreading. What are the causes? This condition is caused by an infection in or around the tooth. This can be from: Very bad tooth decay (cavities). A bad injury to the tooth, such as a broken or chipped tooth. What increases the risk? The risk to get an abscess is higher in males. It is also more likely in people who: Have dental decay. Have very bad gum disease. Eat sugary snacks between meals. Use tobacco. Have diabetes. Have a weak disease-fighting system (immune system). Do not brush their teeth regularly. What are the signs or symptoms? Some mild symptoms are: Tenderness. Bad breath. Fever. A sharp, sour taste in the mouth. Pain in and around the infected tooth. Worse symptoms of this condition include: Swollen neck glands. Chills. Pus draining around the tooth. Swelling and redness around the tooth, the mouth, or the face. Very bad pain in and around the tooth. The worst symptoms can include: Difficulty swallowing. Difficulty opening your mouth. Feeling like you may vomit or vomiting. How is this treated? This is  treated by getting rid of the infection. Your dentist will discuss ways to do this, including: Antibiotic medicines. Antibacterial mouth rinse. An incision in the abscess to drain out the pus. A root canal. Removing the tooth. Follow these instructions at home: Medicines Take over-the-counter and prescription medicines only as told by your dentist. If you were prescribed an antibiotic medicine, take it as told by your dentist. Do not stop taking it even if you start to feel better. If you were prescribed a gel that has numbing medicine in it, use it exactly as told. Ask your dentist if you should avoid driving or using machines while you are taking your medicine. General instructions Rinse your mouth often with salt water. To make salt water, dissolve -1  tsp (3-6 g) of salt in 1 cup (237 mL) of warm water. Eat a soft diet while your mouth is healing. Drink enough fluid to keep your pee (urine) pale yellow. Do not apply heat to the outside of your mouth. Do not smoke or use any products that contain nicotine or tobacco. If you need help quitting, ask your dentist. Keep all follow-up visits. Prevent an abscess Brush your teeth every morning and every night. Use fluoride toothpaste. Floss your teeth each day. Get dental cleanings as often as told by your dentist. Think about getting dental sealant put on teeth that have deep holes (decay). Drink water that has fluoride in it. Most tap water has fluoride. Check the label on bottled water to see if it has fluoride in it. Drink water instead of sugary drinks. Eat healthy meals and snacks. Wear a mouth guard or face shield when you play sports. Contact a doctor if: Your pain is worse and medicine does not help. Get help right away if: You have a fever or chills. Your symptoms suddenly get worse. You have a very bad headache. You have problems breathing or swallowing. You have trouble opening your mouth. You have swelling in your neck  or close to your eye. These symptoms may be an emergency. Get help right away. Call your local emergency services (911 in the [place] Do not wait to see if the symptoms will go away. Do not drive yourself to the hospital. Summary A dental abscess is an area of pus in or around a tooth. It is caused by an infection. Treatment will help with symptoms and prevent the infection from spreading. Take over-the-counter and prescription medicines only as told by your dentist. To prevent an abscess, take good care of your teeth. Brush your teeth every morning and night. Use floss every day. Get dental cleanings as often as told by your dentist. This information is not intended to replace advice given to you by your health care provider. Make sure you discuss any questions you have with your health care provider. Document Revised: 06/21/2020 Document Reviewed: 06/21/2020 Elsevier Patient Education  2024 [employer].  [facility] [phone]   [facility] [phone]   [facility] [phone]   [facility] [place] [phone]   [facility] [phone]   [facility] [phone]   [facility] [phone]   [facility] [place] [phone]   Medicaid Children under 21 [facility] (2 locations: [place] and [place])   [place]: [phone]   [place]: [phone]       [place]       [facility] [phone]   [facility] [phone]   [facility] [phone] (Resource; not on site)   [employer] [facility] [phone]       [facility] [phone]       [facility]       [facility] [phone]       [facility]       Emergency Dentist 24/7 [facility] [phone]   [facility] [phone]   [facility] [place] [phone]        [facility]       [facility]ental Care at [facility] [phone]   [facility] [phone]   [facility] [phone]   [facility] [place] [phone]      If you  have been instructed to have an in-person evaluation today at a local Urgent Care facility, please use the link below. It will take you to a list of all of our available [facility], including address, phone number and hours of operation. Please do not delay care.  [facility]  If you or a family member do not have a primary care provider, use the link below to schedule a visit and establish care. When you choose a [facility] primary care physician or advanced practice provider, you gain a long-term partner in health. Find a Primary Care Provider  Learn more about 's in-office and virtual care options:  - Get Care Now

## 2023-05-14 NOTE — Progress Notes (Signed)
Virtual Visit Consent   Julian Richardson, you are scheduled for a virtual visit with a [facility] provider today. Just as with appointments in the office, your consent must be obtained to participate. Your consent will be active for this visit and any virtual visit you may have with one of our providers in the next 365 days. If you have a MyChart account, a copy of this consent can be sent to you electronically.  As this is a virtual visit, video technology does not allow for your provider to perform a traditional examination. This may limit your provider's ability to fully assess your condition. If your provider identifies any concerns that need to be evaluated in person or the need to arrange testing (such as labs, EKG, etc.), we will make arrangements to do so. Although advances in technology are sophisticated, we cannot ensure that it will always work on either your end or our end. If the connection with a video visit is poor, the visit may have to be switched to a telephone visit. With either a video or telephone visit, we are not always able to ensure that we have a secure connection.  By engaging in this virtual visit, you consent to the provision of healthcare and authorize for your insurance to be billed (if applicable) for the services provided during this visit. Depending on your insurance coverage, you may receive a charge related to this service.  I need to obtain your verbal consent now. Are you willing to proceed with your visit today? Julian Richardson has provided verbal consent on 05/14/2023 for a virtual visit (video or telephone). Piedad Climes, [place]  Date: 05/14/2023 2:22 PM  Virtual Visit via Video Note   I, Piedad Climes, connected with  Julian Richardson  ([id number], 30-Dec-1970, 53) on 05/14/23 at  2:15 PM EST by a video-enabled telemedicine application and verified that I am speaking with the correct person using two identifiers.  Location: Patient: Virtual Visit Location Patient:  Home Provider: Virtual Visit Location Provider: Home Office   I discussed the limitations of evaluation and management by telemedicine and the availability of in person appointments. The patient expressed understanding and agreed to proceed.    History of Present Illness: Julian Richardson is a 53 y.o. who identifies as a male who was assigned male at birth, and is being seen today for some ongoing issue with dentition since December. Notes recently getting in with a dentist who notes multiple issues including 10 large cavities and an impacted R wisdom tooth with possible infection. Is having issue getting an oral surgeon to do several extractions but this appt is not until April. Was told he needed to see a provider for antibiotics to help with current infections. Notes intermittent fever and chills with this. None today. Can fully open the jaw.    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 Stomach upset.   Morphine And Codeine     Eyes burning   Nsaids Other (See Comments)    Stomach upset.   Tolmetin Other (See Comments)    Stomach upset.   Aspirin Nausea Only   Reglan [Metoclopramide]     Akathisia type reaction    Medications:  Current Outpatient Medications:    amoxicillin-clavulanate (AUGMENTIN) 875-125 MG tablet, Take 1 tablet by mouth 2 (two) times daily.,  Disp: 14 tablet, Rfl: 0   acetaminophen (TYLENOL) 500 MG tablet, Take 1,000 mg by mouth every 6 (six) hours as needed for mild pain., Disp: , Rfl:    allopurinol (ZYLOPRIM) 100 MG tablet, Take 100 mg by mouth daily., Disp: , Rfl:    amLODipine (NORVASC) 10 MG tablet, Take 10 mg by mouth daily., Disp: , Rfl:    amphetamine-dextroamphetamine (ADDERALL) 10 MG tablet, Take 10 mg by mouth daily with breakfast., Disp: , Rfl:    benzocaine (ORAJEL) 10 % mucosal gel, Use as directed 1 Application in the mouth or throat as needed for mouth pain., Disp: 9 g, Rfl: 1   BOOSTRIX 5-2.5-18.5 LF-MCG/0.5 injection, , Disp: , Rfl:    busPIRone (BUSPAR) 15 MG tablet, Take 1 tablet (15 mg total) by mouth 2 (two) times daily., Disp: 60 tablet, Rfl: 3   Cholecalciferol (VITAMIN D-3 PO), Take 1 tablet by mouth daily., Disp: , Rfl:    clonazePAM (KLONOPIN) 0.5 MG tablet, Take 1 tablet (0.5 mg total) by mouth once as needed for up to 1 dose for anxiety., Disp: 10 tablet, Rfl: 0   Coenzyme Q10 (COQ10 PO), Take 1 tablet by mouth daily., Disp: , Rfl:    colchicine 0.6 MG tablet, Take by mouth as needed., Disp: , Rfl:    DULoxetine (CYMBALTA) 30 MG capsule, Take 1 capsule (30 mg total) by mouth daily., Disp: 10 capsule, Rfl: 0   ezetimibe (ZETIA) 10 MG tablet, Take 1 tablet (10 mg total) by mouth daily., Disp: 30 tablet, Rfl: 2   gabapentin (NEURONTIN) 400 MG capsule, Take 800 mg by mouth at bedtime., Disp: , Rfl:    Ginkgo Biloba 40 MG TABS, Take 1 tablet by mouth daily., Disp: , Rfl:    hydrocortisone 1 % lotion, Apply 1 Application topically 2 (two) times daily., Disp: 118 mL, Rfl: 0   hydrOXYzine (VISTARIL) 25 MG capsule, Take 25 mg by mouth 3 (three) times daily as needed., Disp: , Rfl:    lidocaine (XYLOCAINE) 2 % solution, Use as directed 15 mLs in the mouth or throat as needed., Disp: 100 mL, Rfl: 0   lisdexamfetamine (VYVANSE) 30 MG capsule, Take 30 mg by mouth every morning., Disp: , Rfl:    lisinopril (ZESTRIL) 40  MG tablet, Take 40 mg by mouth daily., Disp: , Rfl:    metoprolol succinate (TOPROL-XL) 100 MG 24 hr tablet, Take 1 tablet (100 mg total) by mouth daily. Take with or immediately following a meal., Disp: 90 tablet, Rfl: 3   MOUNJARO 2.5 MG/0.5ML Pen, Inject 2.5 mg into the skin once a week., Disp: , Rfl:    nystatin cream (MYCOSTATIN), Apply topically 2 (two) times daily., Disp: 30 g, Rfl: 2   Omega-3 Fatty Acids (OMEGA-3 FISH OIL PO), Take 1 capsule by mouth daily., Disp: , Rfl:    omeprazole (PRILOSEC) 20 MG capsule, Take 20 mg by mouth daily., Disp: , Rfl:    rosuvastatin (CRESTOR) 40 MG tablet, Take 1 tablet (40 mg total) by mouth daily., Disp: 90 tablet,  Rfl: 3   spironolactone (ALDACTONE) 25 MG tablet, Take 12.5 mg by mouth daily., Disp: , Rfl:    tadalafil (CIALIS) 20 MG tablet, Take 20 mg by mouth as needed., Disp: , Rfl:    tamsulosin (FLOMAX) 0.4 MG CAPS capsule, , Disp: , Rfl:    testosterone cypionate (DEPOTESTOSTERONE CYPIONATE) 200 MG/ML injection, SMARTSIG:0.5 Milliliter(s) IM Once a Week, Disp: , Rfl:    tirzepatide (MOUNJARO) 5 MG/0.5ML Pen, Inject 5 mg into the skin once a week., Disp: 2 mL, Rfl: 0   venlafaxine XR (EFFEXOR-XR) 150 MG 24 hr capsule, Take by mouth daily with breakfast. Patient taking twice daily, 300mg  daily., Disp: , Rfl:    venlafaxine XR (EFFEXOR-XR) 75 MG 24 hr capsule, Take 75 mg by mouth every morning., Disp: , Rfl:   Current Facility-Administered Medications:    methylPREDNISolone acetate (DEPO-MEDROL) injection 40 mg, 40 mg, Intramuscular, Once,   Observations/Objective: Patient is well-developed, well-nourished in no acute distress.  Resting comfortably at home.  Head is normocephalic, atraumatic.  No labored breathing. Speech is clear and coherent with logical content.  Patient is alert and oriented at baseline.   Assessment and Plan: 1. Dental infection (Primary) - amoxicillin-clavulanate (AUGMENTIN) 875-125 MG tablet; Take 1 tablet by mouth 2  (two) times daily.  Dispense: 14 tablet; Refill: 0  Supportive measures and OTC medications reviewed. Augmentin per orders. Follow-up with specialist as scheduled.   Follow Up Instructions: I discussed the assessment and treatment plan with the patient. The patient was provided an opportunity to ask questions and all were answered. The patient agreed with the plan and demonstrated an understanding of the instructions.  A copy of instructions were sent to the patient via MyChart unless otherwise noted below.   The patient was advised to call back or seek an in-person evaluation if the symptoms worsen or if the condition fails to improve as anticipated.    Piedad Climes, PA-C

## 2023-05-15 ENCOUNTER — Ambulatory Visit: Payer: Medicare HMO | Admitting: Internal Medicine

## 2023-05-21 ENCOUNTER — Other Ambulatory Visit: Payer: Self-pay | Admitting: Family Medicine

## 2023-05-21 ENCOUNTER — Encounter: Payer: Self-pay | Admitting: Family Medicine

## 2023-05-21 MED ORDER — PREDNISONE 20 MG PO TABS: 20.0000 mg | ORAL_TABLET | Freq: Two times a day (BID) | ORAL | 0 refills | Status: AC

## 2023-05-21 NOTE — Telephone Encounter (Signed)
I'm sending in a prescription for Prednisone 20 mg, to be taken twice daily for five days, to help reduce inflammation and relieve your ear blockage.  In the meantime, staying hydrated, using steam or a warm compress, and considering an over-the-counter decongestant

## 2023-05-22 ENCOUNTER — Encounter: Payer: Self-pay | Admitting: Family Medicine

## 2023-05-23 ENCOUNTER — Emergency Department (HOSPITAL_COMMUNITY)
Admission: EM | Admit: 2023-05-23 | Discharge: 2023-05-23 | Disposition: A | Discharge status: Home / Self Care | Payer: 59

## 2023-05-23 ENCOUNTER — Other Ambulatory Visit: Payer: Self-pay

## 2023-05-23 ENCOUNTER — Encounter (HOSPITAL_COMMUNITY): Payer: Self-pay

## 2023-05-23 DIAGNOSIS — H6122 Impacted cerumen, left ear: Secondary | ICD-10-CM | POA: Insufficient documentation

## 2023-05-23 DIAGNOSIS — H6692 Otitis media, unspecified, left ear: Secondary | ICD-10-CM | POA: Insufficient documentation

## 2023-05-23 DIAGNOSIS — H6690 Otitis media, unspecified, unspecified ear: Secondary | ICD-10-CM

## 2023-05-23 DIAGNOSIS — H9202 Otalgia, left ear: Secondary | ICD-10-CM | POA: Diagnosis present

## 2023-05-23 MED ORDER — AMOXICILLIN 500 MG PO CAPS: 500.0000 mg | ORAL_CAPSULE | Freq: Three times a day (TID) | ORAL | 0 refills | Status: DC

## 2023-05-23 NOTE — ED Notes (Addendum)
Pt is lying in bed stating his ear is in severe pain. Earwax is seen in pt's ear. Stated he has been putting mineral oil and ear drops in with no relief. Denies pain anywhere else. Ear is not sensitive to touch.

## 2023-05-23 NOTE — ED Provider Notes (Signed)
[facility]MENT AT [facility] Provider Note   CSN: [id number] Arrival date & time: 05/23/23  1301     History  Chief Complaint  Patient presents with   Otalgia    Julian Richardson is a 53 y.o. male.   Otalgia Associated symptoms: hearing loss   Associated symptoms: no abdominal pain, no congestion, no cough, no ear discharge, no fever, no headaches, no sore throat and no vomiting         Julian Richardson is a 53 y.o. male who presents to the Emergency Department complaining of left ear pain x 1 week.  He recently had left upper dental procedure and was prescribed penicillin but states he could not take it because it upset his stomach.  He was then started on doxycycline which she completed without relief.  Describes throbbing pain to his left ear with decreased hearing.  Denies any neck pain, facial swelling, neck stiffness, fever or chills.  No headache or dizziness.   Home Medications Prior to Admission medications   Medication Sig Start Date End Date Taking? Authorizing Provider  acetaminophen (TYLENOL) 500 MG tablet Take 1,000 mg by mouth every 6 (six) hours as needed for mild pain.    [provider]  allopurinol (ZYLOPRIM) 100 MG tablet Take 100 mg by mouth daily. 03/06/21   [provider]  amLODipine (NORVASC) 10 MG tablet Take 10 mg by mouth daily. 12/21/22   [provider]  amoxicillin-clavulanate (AUGMENTIN) 875-125 MG tablet Take 1 tablet by mouth 2 (two) times daily. 05/14/23   Waldon Merl, PA-C  amphetamine-dextroamphetamine (ADDERALL) 10 MG tablet Take 10 mg by mouth daily with breakfast. 03/06/23   [provider]  benzocaine (ORAJEL) 10 % mucosal gel Use as directed 1 Application in the mouth or throat as needed for mouth pain. 04/24/23   Del Nigel Berthold, FNP  BOOSTRIX 5-2.5-18.5 LF-MCG/0.5 injection  10/13/22   [provider]  busPIRone (BUSPAR) 15 MG tablet Take 1 tablet (15 mg total)  by mouth 2 (two) times daily. 04/01/23   Del Nigel Berthold, FNP  Cholecalciferol (VITAMIN D-3 PO) Take 1 tablet by mouth daily.    [provider]  clonazePAM (KLONOPIN) 0.5 MG tablet Take 1 tablet (0.5 mg total) by mouth once as needed for up to 1 dose for anxiety. 04/17/23   Del Nigel Berthold, FNP  Coenzyme Q10 (COQ10 PO) Take 1 tablet by mouth daily.    [provider]  colchicine 0.6 MG tablet Take by mouth as needed. 08/08/21   [provider]  DULoxetine (CYMBALTA) 30 MG capsule Take 1 capsule (30 mg total) by mouth daily. 04/23/23   Del Nigel Berthold, FNP  ezetimibe (ZETIA) 10 MG tablet Take 1 tablet (10 mg total) by mouth daily. 02/06/23   Del Nigel Berthold, FNP  gabapentin (NEURONTIN) 400 MG capsule Take 800 mg by mouth at bedtime. 01/14/23   [provider]  Ginkgo Biloba 40 MG TABS Take 1 tablet by mouth daily.    [provider]  hydrocortisone 1 % lotion Apply 1 Application topically 2 (two) times daily. 05/05/23   Del Nigel Berthold, FNP  hydrOXYzine (VISTARIL) 25 MG capsule Take 25 mg by mouth 3 (three) times daily as needed. 08/15/21   [provider]  lidocaine (XYLOCAINE) 2 % solution Use as directed 15 mLs in the mouth or throat as needed. 04/17/23   Del Nigel Berthold, FNP  lisdexamfetamine Arlyce Harman)  30 MG capsule Take 30 mg by mouth every morning. 01/29/23   [provider]  lisinopril (ZESTRIL) 40 MG tablet Take 40 mg by mouth daily. 02/12/21   [provider]  metoprolol succinate (TOPROL-XL) 100 MG 24 hr tablet Take 1 tablet (100 mg total) by mouth daily. Take with or immediately following a meal. 03/12/23   Del Newman Nip, Tenna Child, FNP  MOUNJARO 2.5 MG/0.5ML Pen Inject 2.5 mg into the skin once a week. 04/01/23   [provider]  nystatin cream (MYCOSTATIN) Apply topically 2 (two) times daily. 04/17/23   Del Newman Nip, Tenna Child, FNP  Omega-3 Fatty Acids (OMEGA-3  FISH OIL PO) Take 1 capsule by mouth daily.    [provider]  omeprazole (PRILOSEC) 20 MG capsule Take 20 mg by mouth daily.    [provider]  predniSONE (DELTASONE) 20 MG tablet Take 1 tablet (20 mg total) by mouth 2 (two) times daily with a meal for 5 days. 05/21/23 05/26/23  Del Nigel Berthold, FNP  rosuvastatin (CRESTOR) 40 MG tablet Take 1 tablet (40 mg total) by mouth daily. 02/04/23   Del Nigel Berthold, FNP  spironolactone (ALDACTONE) 25 MG tablet Take 12.5 mg by mouth daily. 01/20/23   [provider]  tadalafil (CIALIS) 20 MG tablet Take 20 mg by mouth as needed. 02/04/23   [provider]  tamsulosin (FLOMAX) 0.4 MG CAPS capsule     [provider]  testosterone cypionate (DEPOTESTOSTERONE CYPIONATE) 200 MG/ML injection SMARTSIG:0.5 Milliliter(s) IM Once a Week 01/27/23   [provider]  tirzepatide Greggory Keen) 5 MG/0.5ML Pen Inject 5 mg into the skin once a week. 03/12/23   Del Nigel Berthold, FNP  venlafaxine XR (EFFEXOR-XR) 150 MG 24 hr capsule Take by mouth daily with breakfast. Patient taking twice daily, 300mg  daily. 04/03/15   [provider]  venlafaxine XR (EFFEXOR-XR) 75 MG 24 hr capsule Take 75 mg by mouth every morning. 01/12/23   [provider]  Chlorpheniramine Maleate (ALLERGY PO) Take 1 tablet by mouth daily as needed (allergies).  01/16/14  [provider]      Allergies    Ibuprofen, Morphine and codeine, Nsaids, Tolmetin, Aspirin, and Reglan [metoclopramide]    Review of Systems   Review of Systems  Constitutional:  Negative for appetite change, chills and fever.  HENT:  Positive for ear pain and hearing loss. Negative for congestion, ear discharge, facial swelling, sore throat and trouble swallowing.   Respiratory:  Negative for cough and shortness of breath.   Cardiovascular:  Negative for chest pain.  Gastrointestinal:  Negative for abdominal pain, nausea and  vomiting.  Neurological:  Negative for dizziness, syncope, weakness, light-headedness and headaches.    Physical Exam Updated Vital Signs BP (!) 145/71   Pulse 82   Temp 98.2 F (36.8 C) (Oral)   Resp 16   Ht 6\' 1"  (1.854 m)   Wt (!) 158.8 kg   SpO2 99%   BMI 46.19 kg/m  Physical Exam Vitals and nursing note reviewed.  Constitutional:      General: He is not in acute distress.    Appearance: Normal appearance. He is not ill-appearing or toxic-appearing.  HENT:     Right Ear: Hearing, tympanic membrane and external ear normal.     Left Ear: External ear normal. Decreased hearing noted. There is impacted cerumen. No mastoid tenderness.     Ears:     Comments: Large amount of cerumen to left ear canal.  Unable to visualize TM.  No mastoid tenderness or pain of the external ear    Mouth/Throat:     Mouth: Mucous membranes are moist.     Pharynx: Oropharynx is clear.  Eyes:     Conjunctiva/sclera: Conjunctivae normal.  Cardiovascular:     Rate and Rhythm: Normal rate and regular rhythm.     Pulses: Normal pulses.  Pulmonary:     Effort: Pulmonary effort is normal.  Musculoskeletal:        General: Normal range of motion.     Cervical back: Normal range of motion. No rigidity or tenderness.  Lymphadenopathy:     Cervical: No cervical adenopathy.  Skin:    General: Skin is warm.  Neurological:     General: No focal deficit present.     Mental Status: He is alert.     Sensory: No sensory deficit.     Motor: No weakness.     ED Results / Procedures / Treatments   Labs (all labs ordered are listed, but only abnormal results are displayed) Labs Reviewed - No data to display  EKG None  Radiology No results found.  Procedures Procedures    Medications Ordered in ED Medications - No data to display  ED Course/ Medical Decision Making/ A&P                                 Medical Decision Making Patient here with left ear pain x 1 week.  Decreased hearing as  well.  Endorses recent dental procedure was prescribed Pen-Vee K but was unable to tolerate medication.  Completed course of doxycycline without improvement of symptoms.   I suspect otitis media, impacted cerumen also likely given exam findings.  TM perforation possible  Amount and/or Complexity of Data Reviewed Discussion of management or test interpretation with external provider(s): Impacted cerumen to left ear canal was successfully removed by me using a lighted curette.  On further exam, left TM is erythematous with mild bulging.  No perforation. After cerumen removal, patient reports feeling much better and ear pain has improved.  He states that he has taken amoxicillin in the past without GI upset or allergic reaction.  Prescription written, recommend Tylenol if needed for pain.  Will follow-up with PCP in 1 week to ensure resolution.  Return precautions were also given           Final Clinical Impression(s) / ED Diagnoses Final diagnoses:  Impacted cerumen of left ear  Acute otitis media, unspecified otitis media type    Rx / DC Orders ED Discharge Orders     None         Pauline Aus, PA-C 05/23/23 1429    Coral Spikes, DO 05/24/23 1602

## 2023-05-23 NOTE — Discharge Instructions (Signed)
Take the amoxicillin as directed until finished.  You may take Tylenol every 4 hours if needed for pain.  Please follow-up with your primary care provider for recheck in 1 week.  Return to emergency department for any new or worsening symptoms.

## 2023-05-23 NOTE — ED Triage Notes (Signed)
Pt arrived via POV c/o left ear pain. X 1 week. Pt reports recent dental procedures and the ear pain began after.

## 2023-06-01 ENCOUNTER — Other Ambulatory Visit: Payer: Self-pay | Admitting: Family Medicine

## 2023-06-02 ENCOUNTER — Other Ambulatory Visit: Payer: Self-pay | Admitting: Family Medicine

## 2023-06-02 ENCOUNTER — Encounter: Payer: Self-pay | Admitting: Family Medicine

## 2023-06-02 MED ORDER — TIRZEPATIDE 7.5 MG/0.5ML ~~LOC~~ SOAJ
7.5000 mg | SUBCUTANEOUS | 0 refills | Status: DC
Start: 2023-06-02 — End: 2023-06-11

## 2023-06-02 MED ORDER — CLONAZEPAM 0.5 MG PO TABS: 0.5000 mg | ORAL_TABLET | Freq: Once | ORAL | 0 refills | Status: DC | PRN

## 2023-06-03 ENCOUNTER — Other Ambulatory Visit: Payer: Self-pay | Admitting: Family Medicine

## 2023-06-03 MED ORDER — CLONAZEPAM 0.5 MG PO TABS: 0.5000 mg | ORAL_TABLET | Freq: Once | ORAL | 0 refills | Status: DC | PRN

## 2023-06-03 NOTE — Telephone Encounter (Signed)
 Please explain: Klonopin  was sent to [facility].  For Mounjaro  (tirzepatide ), since your previous dose is 5 mg, the typical plan is:  Next Step (7.5 mg): Increase to 7.5 mg once weekly after 4 weeks on 5 mg. Further Increases: Increase every 4 weeks as follows: 7.5 mg - 10 mg - 12.5 mg - 15 mg (maximum dose).

## 2023-06-04 NOTE — Telephone Encounter (Signed)
 Is insurance denied the [facility] because he is not diabetic. They do not cover it for prediabetes. They require proof of A1c over 6.5

## 2023-06-04 NOTE — Telephone Encounter (Signed)
 Please inform patient,  Hemoglobin A1c 6.2, & call insurance to verify what weight loss medications are covered

## 2023-06-04 NOTE — Telephone Encounter (Signed)
 His mounjaro was denied because he is not diabetic. He is asking about zepbound now

## 2023-06-06 ENCOUNTER — Other Ambulatory Visit: Payer: Self-pay | Admitting: Family Medicine

## 2023-06-09 ENCOUNTER — Telehealth: Payer: Self-pay | Admitting: Family Medicine

## 2023-06-09 NOTE — Telephone Encounter (Signed)
He is wanting Zepbound called into his pharmacy so we can submit a PA and see if it can be approved.

## 2023-06-09 NOTE — Telephone Encounter (Signed)
Please inform patient, needs to schedule an office visit for weight loss management

## 2023-06-10 ENCOUNTER — Ambulatory Visit: Payer: Self-pay | Admitting: Family Medicine

## 2023-06-10 NOTE — Telephone Encounter (Signed)
Chief Complaint: Hypertension Symptoms: Insomnia, SOB, fatigue, headache, weakness Frequency: Since stopped taking Mounjaro Pertinent Negatives: Patient denies blurred vision, chest pain, difficulty breathing Disposition: [] ED /[] Urgent Care (no appt availability in office) / [x] Appointment(In office/virtual)/ []  [facility]/ [] Home Care/ [] Refused Recommended Disposition /[] [facility]/ []  Follow-up with PCP Additional Notes: Pt states his prior authorization was denied for [facility]. Pt states he has been out of the [facility] for 3 weeks and has been experiencing symptoms of high BP, insomnia, fatigue, headache, SOB. Pt states his PCP is aware about the [facility]. An appt was made for tomorrow at PCP office with different provider due to PCP not available. This RN educated pt on home care, new-worsening symptoms, when to call back/seek emergent care. Pt verbalized understanding and agrees to plan.  Copied from CRM [id number]. Topic: Clinical - Red Word Triage >> Jun 10, 2023  1:16 PM Zayanah H wrote: [facility] that prompted transfer to Nurse Triage: having side effects while not being ontirzepatide (MOUNJARO) 7.5 MG/0.5ML Pe, not able to sleep well blood pressure 176/91 wake up gasping for air, needs appt Reason for Disposition  Systolic BP  >= 160 OR Diastolic >= 100  Answer Assessment - Initial Assessment Questions 1. BLOOD PRESSURE: "What is the blood pressure?" "Did you take at least two measurements 5 minutes apart?"     176/91  2. ONSET: "When did you take your blood pressure?"     Yesterday 3. HOW: "How did you take your blood pressure?" (e.g., automatic home BP monitor, visiting nurse)     Automatic home BP monitor 4. HISTORY: "Do you have a history of high blood pressure?"     Denies 5. MEDICINES: "Are you taking any medicines for blood pressure?" "Have you missed any doses recently?"     Metoprolol, lisinopril, spironolactone  6. OTHER SYMPTOMS: "Do you have any  symptoms?" (e.g., blurred vision, chest pain, difficulty breathing, headache, weakness)     Headache, weakness  Protocols used: Blood Pressure - High-A-AH

## 2023-06-10 NOTE — Telephone Encounter (Signed)
Pt. Informed to  call and schedule

## 2023-06-11 ENCOUNTER — Ambulatory Visit: Payer: 59 | Admitting: Internal Medicine

## 2023-06-11 ENCOUNTER — Encounter: Payer: Self-pay | Admitting: Internal Medicine

## 2023-06-11 DIAGNOSIS — R7303 Prediabetes: Secondary | ICD-10-CM

## 2023-06-11 DIAGNOSIS — I10 Essential (primary) hypertension: Secondary | ICD-10-CM

## 2023-06-11 DIAGNOSIS — G4733 Obstructive sleep apnea (adult) (pediatric): Secondary | ICD-10-CM | POA: Diagnosis not present

## 2023-06-11 DIAGNOSIS — E88819 Insulin resistance, unspecified: Secondary | ICD-10-CM

## 2023-06-11 MED ORDER — TIRZEPATIDE-WEIGHT MANAGEMENT 2.5 MG/0.5ML ~~LOC~~ SOLN: 2.5000 mg | SUBCUTANEOUS | 0 refills | Status: DC

## 2023-06-11 NOTE — Telephone Encounter (Signed)
Seen Julian Richardson today

## 2023-06-11 NOTE — Assessment & Plan Note (Addendum)
BP Readings from Last 1 Encounters:  06/11/23 (!) 164/94   Better controlled at home according to him with lisinopril 40 mg QD, amlodipine 10 mg QD, metoprolol 100 mg QD, and spironolactone 12.5 mg QD -followed by nephrology Counseled for compliance with the medications Advised DASH diet and moderate exercise/walking, at least 150 mins/week

## 2023-06-11 NOTE — Patient Instructions (Signed)
Please continue to take medications as prescribed. We have sent Zepbound prescription today.  Please continue to follow low carb diet and perform moderate exercise/walking as tolerated.

## 2023-06-11 NOTE — Assessment & Plan Note (Signed)
Lab Results  Component Value Date   HGBA1C 6.2 (H) 02/03/2023   Unclear from the chart of whether he has prediabetes or type II DM pain he was placed on Mounjaro by nephrology It is likely that his HbA1c would rise above 6.5 if he does not start treatment with GLP-1 agonist or other antidiabetic treatment Will try Zepbound for morbid obesity for now Continue to follow low-carb diet

## 2023-06-11 NOTE — Progress Notes (Signed)
Established Patient Office Visit  Subjective:  Patient ID: Julian Richardson, male    DOB: [birth year]  Age: 53 y.o. MRN: [id number]  CC:  Chief Complaint  Patient presents with   Fatigue    Has been off medication for diabetes has been causing sx of fatigue.     HPI Julian Richardson is a 53 y.o. male with past medical history of HTN, OSA, ADD, prediabetes and morbid obesity who presents for concern of fatigue since stopping Mounjaro.  He was taking Mounjaro 2.5 mg qw till 2 weeks ago for morbid obesity and prediabetes, but his insurance has denied coverage recently.  Although his recent HbA1c was 6.2 in 10/24, he was taking Mounjaro at that time, which was prescribed by Nephrology. It is unclear if he had HbA1c > 6.5 when he started [facility].  He had tolerated Mounjaro well, and had about 20 lbs weight loss with it.  It had improved his sleep apnea symptoms as well.  He reports fatigue and rebound weight gain recently despite following low-carb diet.  Of note, he has a history of gastric bypass surgery for morbid obesity.  HTN: His BP was elevated today, but he reports that his blood pressure remains better controlled at home.  He takes lisinopril 40 mg QD, amlodipine 10 mg QD, metoprolol 100 mg QD and spironolactone 12.5 mg QD, followed by nephrology.  Denies headache, dizziness, chest pain or palpitations currently.  Past Medical History:  Diagnosis Date   ADHD (attention deficit hyperactivity disorder)    Anxiety    Gout    Hypertension    Sleep apnea     Past Surgical History:  Procedure Laterality Date   GASTRIC BYPASS     WISDOM TOOTH EXTRACTION Bilateral     Family History  Problem Relation Age of Onset   Hypertension Mother    Diabetes Mother    Hypertension Father    Hyperlipidemia Father    Heart attack Father     Social History   Socioeconomic History   Marital status: Single    Spouse name: Not on file   Number of children: Not on file   Years of education: Not on  file   Highest education level: Bachelor's degree (e.g., BA, AB, BS)  Occupational History   Not on file  Tobacco Use   Smoking status: Never    Passive exposure: Never   Smokeless tobacco: Never  Vaping Use   Vaping status: Never Used  Substance and Sexual Activity   Alcohol use: No   Drug use: No   Sexual activity: Not Currently  Other Topics Concern   Not on file  Social History Narrative   Not on file   Social Drivers of Health   Financial Resource Strain: High Risk (03/11/2023)   Overall Financial Resource Strain (CARDIA)    Difficulty of Paying Living Expenses: Hard  Food Insecurity: Food Insecurity Present (03/11/2023)   Hunger Vital Sign    Worried About Running Out of Food in the Last Year: Sometimes true    Ran Out of Food in the Last Year: Sometimes true  Transportation Needs: No Transportation Needs (03/11/2023)   PRAPARE - Administrator, Civil Service (Medical): No    Lack of Transportation (Non-Medical): No  Physical Activity: Insufficiently Active (03/11/2023)   Exercise Vital Sign    Days of Exercise per Week: 2 days    Minutes of Exercise per Session: 40 min  Stress: Stress Concern Present (03/11/2023)   [place]  [employer] Health - Occupational Stress Questionnaire    Feeling of Stress : Rather much  Social Connections: Moderately Integrated (03/11/2023)   Social Connection and Isolation Panel [NHANES]    Frequency of Communication with Friends and Family: Three times a week    Frequency of Social Gatherings with Friends and Family: Once a week    Attends Religious Services: More than 4 times per year    Active Member of [employer] or Organizations: Yes    Attends Banker Meetings: More than 4 times per year    Marital Status: Never married  Intimate Partner Violence: Unknown (08/02/2021)   Received from [employer], [facility]   HITS    Physically Hurt: Not on file    Insult or Talk Down To: Not on file     Threaten Physical Harm: Not on file    Scream or Curse: Not on file    Outpatient Medications Prior to Visit  Medication Sig Dispense Refill   acetaminophen (TYLENOL) 500 MG tablet Take 1,000 mg by mouth every 6 (six) hours as needed for mild pain.     allopurinol (ZYLOPRIM) 100 MG tablet Take 100 mg by mouth daily.     amLODipine (NORVASC) 10 MG tablet Take 10 mg by mouth daily.     amphetamine-dextroamphetamine (ADDERALL) 10 MG tablet Take 10 mg by mouth daily with breakfast.     BOOSTRIX 5-2.5-18.5 LF-MCG/0.5 injection      Cholecalciferol (VITAMIN D-3 PO) Take 1 tablet by mouth daily.     clonazePAM (KLONOPIN) 0.5 MG tablet Take 1 tablet (0.5 mg total) by mouth once as needed for up to 1 dose for anxiety. 10 tablet 0   Coenzyme Q10 (COQ10 PO) Take 1 tablet by mouth daily.     gabapentin (NEURONTIN) 400 MG capsule Take 800 mg by mouth at bedtime.     Ginkgo Biloba 40 MG TABS Take 1 tablet by mouth daily.     hydrocortisone 1 % lotion Apply 1 Application topically 2 (two) times daily. 118 mL 0   lisinopril (ZESTRIL) 40 MG tablet Take 40 mg by mouth daily.     metoprolol succinate (TOPROL-XL) 100 MG 24 hr tablet Take 1 tablet (100 mg total) by mouth daily. Take with or immediately following a meal. 90 tablet 3   nystatin cream (MYCOSTATIN) Apply topically 2 (two) times daily. 30 g 2   Omega-3 Fatty Acids (OMEGA-3 FISH OIL PO) Take 1 capsule by mouth daily.     omeprazole (PRILOSEC) 20 MG capsule Take 20 mg by mouth daily.     spironolactone (ALDACTONE) 25 MG tablet Take 12.5 mg by mouth daily.     tadalafil (CIALIS) 20 MG tablet Take 20 mg by mouth as needed.     tamsulosin (FLOMAX) 0.4 MG CAPS capsule      testosterone cypionate (DEPOTESTOSTERONE CYPIONATE) 200 MG/ML injection SMARTSIG:0.5 Milliliter(s) IM Once a Week     venlafaxine XR (EFFEXOR-XR) 150 MG 24 hr capsule Take by mouth daily with breakfast. Patient taking twice daily, 300mg  daily.     amoxicillin (AMOXIL) 500 MG capsule  Take 1 capsule (500 mg total) by mouth 3 (three) times daily. 21 capsule 0   benzocaine (ORAJEL) 10 % mucosal gel Use as directed 1 Application in the mouth or throat as needed for mouth pain. 9 g 1   busPIRone (BUSPAR) 15 MG tablet Take 1 tablet (15 mg total) by mouth 2 (two) times daily. 60 tablet 3   DULoxetine (  CYMBALTA) 30 MG capsule Take 1 capsule (30 mg total) by mouth daily. 10 capsule 0   ezetimibe (ZETIA) 10 MG tablet Take 1 tablet (10 mg total) by mouth daily. 30 tablet 2   lidocaine (XYLOCAINE) 2 % solution Use as directed 15 mLs in the mouth or throat as needed. 100 mL 0   lisdexamfetamine (VYVANSE) 30 MG capsule Take 30 mg by mouth every morning.     rosuvastatin (CRESTOR) 40 MG tablet Take 1 tablet (40 mg total) by mouth daily. 90 tablet 3   venlafaxine XR (EFFEXOR-XR) 75 MG 24 hr capsule Take 75 mg by mouth every morning.     colchicine 0.6 MG tablet Take by mouth as needed. (Patient not taking: Reported on 06/11/2023)     hydrOXYzine (VISTARIL) 25 MG capsule Take 25 mg by mouth 3 (three) times daily as needed. (Patient not taking: Reported on 06/11/2023)     tirzepatide [facility]) 7.5 MG/0.5ML Pen Inject 7.5 mg into the skin once a week. (Patient not taking: Reported on 06/11/2023) 2 mL 0   Facility-Administered Medications Prior to Visit  Medication Dose Route Frequency Provider Last Rate Last Admin   methylPREDNISolone acetate (DEPO-MEDROL) injection 40 mg  40 mg Intramuscular Once         Allergies  Allergen Reactions   Ibuprofen Other (See Comments)    Stomach upset. Stomach upset.   Morphine And Codeine     Eyes burning   Nsaids Other (See Comments)    Stomach upset.   Tolmetin Other (See Comments)    Stomach upset.   Aspirin Nausea Only   Reglan [Metoclopramide]     Akathisia type reaction     ROS Review of Systems  Constitutional:  Positive for fatigue. Negative for chills and fever.  HENT:  Negative for congestion and sore throat.   Eyes:  Negative for  pain and discharge.  Respiratory:  Negative for cough and shortness of breath.   Cardiovascular:  Negative for chest pain and palpitations.  Gastrointestinal:  Negative for diarrhea, nausea and vomiting.  Endocrine: Negative for polydipsia and polyuria.  Genitourinary:  Negative for dysuria and hematuria.  Musculoskeletal:  Negative for neck pain and neck stiffness.  Skin:  Negative for rash.  Neurological:  Negative for dizziness and weakness.  Psychiatric/Behavioral:  Negative for agitation and behavioral problems.       Objective:    Physical Exam Vitals reviewed.  Constitutional:      General: He is not in acute distress.    Appearance: He is obese. He is not diaphoretic.  HENT:     Nose: Nose normal.     Mouth/Throat:     Mouth: Mucous membranes are moist.  Eyes:     General: No scleral icterus.    Extraocular Movements: Extraocular movements intact.  Cardiovascular:     Rate and Rhythm: Normal rate and regular rhythm.     Heart sounds: Normal heart sounds. No murmur heard. Pulmonary:     Breath sounds: Normal breath sounds. No wheezing or rales.  Musculoskeletal:     Cervical back: Neck supple. No tenderness.     Right lower leg: No edema.     Left lower leg: No edema.  Skin:    General: Skin is warm.     Findings: No rash.  Neurological:     General: No focal deficit present.     Mental Status: He is alert and oriented to person, place, and time.  Psychiatric:  Mood and Affect: Mood normal.        Behavior: Behavior normal.     BP (!) 164/94 (BP Location: Left Arm)   Pulse 82   Ht 6' (1.829 m)   Wt (!) 357 lb 3.2 oz (162 kg)   SpO2 93%   BMI 48.45 kg/m  Wt Readings from Last 3 Encounters:  06/11/23 (!) 357 lb 3.2 oz (162 kg)  05/23/23 (!) 350 lb 1.5 oz (158.8 kg)  04/17/23 (!) 350 lb 1.3 oz (158.8 kg)    Lab Results  Component Value Date   TSH 0.939 02/03/2023   Lab Results  Component Value Date   WBC 7.9 02/03/2023   HGB 14.3  02/03/2023   HCT 46.3 02/03/2023   MCV 84 02/03/2023   PLT 294 02/03/2023   Lab Results  Component Value Date   NA 143 06/11/2023   K 4.1 06/11/2023   CO2 26 06/11/2023   GLUCOSE 93 06/11/2023   BUN 12 06/11/2023   CREATININE 1.11 06/11/2023   BILITOT 0.3 06/11/2023   ALKPHOS 68 06/11/2023   AST 22 06/11/2023   ALT 25 06/11/2023   PROT 6.5 06/11/2023   ALBUMIN 3.7 (L) 06/11/2023   CALCIUM 9.4 06/11/2023   ANIONGAP 9 03/28/2021   EGFR 80 06/11/2023   Lab Results  Component Value Date   CHOL 180 02/03/2023   Lab Results  Component Value Date   HDL 39 (L) 02/03/2023   Lab Results  Component Value Date   LDLCALC 112 (H) 02/03/2023   Lab Results  Component Value Date   TRIG 161 (H) 02/03/2023   Lab Results  Component Value Date   CHOLHDL 4.6 02/03/2023   Lab Results  Component Value Date   HGBA1C 5.7 (H) 06/11/2023      Assessment & Plan:   Problem List Items Addressed This Visit       Cardiovascular and Mediastinum   Hypertension   BP Readings from Last 1 Encounters:  06/11/23 (!) 164/94   Better controlled at home according to him with lisinopril 40 mg QD, amlodipine 10 mg QD, metoprolol 100 mg QD, and spironolactone 12.5 mg QD -followed by nephrology Counseled for compliance with the medications Advised DASH diet and moderate exercise/walking, at least 150 mins/week        Respiratory   OSA (obstructive sleep apnea)   Sleep study in 2017 showed REI of 21.9, has moderate sleep apnea Has CPAP device, undergoing evaluation for adjustment in CPAP treatment Would benefit from Zepbound with at least 20% weight loss        Other   Morbid obesity (HCC) - Primary   BMI Readings from Last 3 Encounters:  06/11/23 48.45 kg/m  05/23/23 46.19 kg/m  04/17/23 46.19 kg/m   S/p gastric bypass surgery Had responded well to [facility], but was denied by insurance recently -was prescribed by nephrology initially Although his HbA1c was less than 6.5, he was  taking Mounjaro at that time Send Zepbound for morbid obesity -initial BMI: 48.45, increase dose as tolerated Advised to continue to follow low-carb diet and perform moderate exercise      Relevant Medications   tirzepatide (ZEPBOUND) 2.5 MG/0.5ML injection vial   Prediabetes   Lab Results  Component Value Date   HGBA1C 6.2 (H) 02/03/2023   Unclear from the chart of whether he has prediabetes or type II DM pain he was placed on Mounjaro by nephrology It is likely that his HbA1c would rise above 6.5 if he does  not start treatment with GLP-1 agonist or other antidiabetic treatment Will try Zepbound for morbid obesity for now Continue to follow low-carb diet      Relevant Medications   tirzepatide (ZEPBOUND) 2.5 MG/0.5ML injection vial   Other Relevant Orders   CMP14+EGFR (Completed)   Hemoglobin A1c (Completed)   Other Visit Diagnoses       Insulin resistance       Relevant Medications   tirzepatide (ZEPBOUND) 2.5 MG/0.5ML injection vial       Meds ordered this encounter  Medications   tirzepatide (ZEPBOUND) 2.5 MG/0.5ML injection vial    Sig: Inject 2.5 mg into the skin once a week.    Dispense:  2 mL    Refill:  0    Follow-up: Return if symptoms worsen or fail to improve.    Anabel Halon, MD

## 2023-06-11 NOTE — Assessment & Plan Note (Signed)
BMI Readings from Last 3 Encounters:  06/11/23 48.45 kg/m  05/23/23 46.19 kg/m  04/17/23 46.19 kg/m   S/p gastric bypass surgery Had responded well to [facility], but was denied by insurance recently -was prescribed by nephrology initially Although his HbA1c was less than 6.5, he was taking Mounjaro at that time Send Zepbound for morbid obesity -initial BMI: 48.45, increase dose as tolerated Advised to continue to follow low-carb diet and perform moderate exercise

## 2023-06-11 NOTE — Assessment & Plan Note (Signed)
Undergoing evaluation for adjustment in CPAP treatment

## 2023-06-12 ENCOUNTER — Telehealth: Payer: Self-pay | Admitting: Family Medicine

## 2023-06-12 ENCOUNTER — Encounter: Payer: Self-pay | Admitting: Internal Medicine

## 2023-06-12 ENCOUNTER — Encounter: Payer: Self-pay | Admitting: Family Medicine

## 2023-06-12 LAB — CMP14+EGFR
ALT: 25 [IU]/L (ref 0–44)
AST: 22 [IU]/L (ref 0–40)
Albumin: 3.7 g/dL — ABNORMAL LOW (ref 3.8–4.9)
Alkaline Phosphatase: 68 [IU]/L (ref 44–121)
BUN/Creatinine Ratio: 11 (ref 9–20)
BUN: 12 mg/dL (ref 6–24)
Bilirubin Total: 0.3 mg/dL (ref 0.0–1.2)
CO2: 26 mmol/L (ref 20–29)
Calcium: 9.4 mg/dL (ref 8.7–10.2)
Chloride: 103 mmol/L (ref 96–106)
Creatinine, Ser: 1.11 mg/dL (ref 0.76–1.27)
Globulin, Total: 2.8 g/dL (ref 1.5–4.5)
Glucose: 93 mg/dL (ref 70–99)
Potassium: 4.1 mmol/L (ref 3.5–5.2)
Sodium: 143 mmol/L (ref 134–144)
Total Protein: 6.5 g/dL (ref 6.0–8.5)
eGFR: 80 mL/min/{1.73_m2} (ref >59)

## 2023-06-12 LAB — HEMOGLOBIN A1C
Est. average glucose Bld gHb Est-mCnc: 117 mg/dL
Hgb A1c MFr Bld: 5.7 % — ABNORMAL HIGH (ref 4.8–5.6)

## 2023-06-12 NOTE — Telephone Encounter (Signed)
Copied from CRM [id number]. Topic: Clinical - Prescription Issue >> Jun 12, 2023  2:42 PM Gery Pray wrote: Reason for CRM: Cyrin from [facility] called to request a PA for the tirzepatide (ZEPBOUND) 2.5 MG/0.5ML injection vial. Rep stated the PA will need to be submitted before or on 02/16 at 9 am. Reference number is [id number]

## 2023-06-15 ENCOUNTER — Other Ambulatory Visit: Payer: Self-pay | Admitting: Family Medicine

## 2023-06-15 MED ORDER — PANTOPRAZOLE SODIUM 40 MG PO TBEC: 40.0000 mg | DELAYED_RELEASE_TABLET | Freq: Every day | ORAL | 2 refills | Status: DC

## 2023-06-16 NOTE — Telephone Encounter (Signed)
Spoke to [employer] .  Pharmacy stated that Zepbound was approved.   Pt. Current insurance member number is [phone] for future refills   Next refill will be scheduled for 07/03/2023. However, rep stated that insurance will terminate on 06/26/23 Verfied by pharmacy rep Soi D.    MA spoke to pt over the phone he stated he has issues with [facility] so he is switching back to [place]   I explained we need updated verified insurance information in the system so that we can restart the process for the Zepbound. He stated that he picked up the approved months supply from the pharmacy yesterday, and that he will bring his current updated insurance card to the office so that a new PA will be started.

## 2023-07-10 ENCOUNTER — Ambulatory Visit: Payer: Medicare HMO | Admitting: Family Medicine

## 2023-07-11 ENCOUNTER — Other Ambulatory Visit: Payer: Self-pay | Admitting: Family Medicine

## 2023-07-15 ENCOUNTER — Ambulatory Visit: Payer: Self-pay | Admitting: Internal Medicine

## 2023-07-29 ENCOUNTER — Other Ambulatory Visit: Payer: Self-pay | Admitting: Family Medicine

## 2023-07-30 MED ORDER — CLONAZEPAM 0.5 MG PO TABS: 0.5000 mg | ORAL_TABLET | Freq: Once | ORAL | 0 refills | Status: DC | PRN

## 2023-08-18 ENCOUNTER — Other Ambulatory Visit: Payer: Self-pay

## 2023-08-18 ENCOUNTER — Encounter: Payer: Self-pay | Admitting: Family Medicine

## 2023-08-18 ENCOUNTER — Other Ambulatory Visit: Payer: Self-pay | Admitting: Family Medicine

## 2023-08-18 MED ORDER — ALLOPURINOL 100 MG PO TABS
100.0000 mg | ORAL_TABLET | Freq: Every day | ORAL | 0 refills | Status: DC
  Filled 2023-08-18: qty 90, 90d supply, fill #0

## 2023-08-18 MED ORDER — TAMSULOSIN HCL 0.4 MG PO CAPS
0.4000 mg | ORAL_CAPSULE | Freq: Every day | ORAL | 0 refills | Status: DC
  Filled 2023-08-18: qty 30, 30d supply, fill #0

## 2023-08-19 ENCOUNTER — Other Ambulatory Visit: Payer: Self-pay

## 2023-08-19 NOTE — Telephone Encounter (Signed)
 Please inform patient, We do not refill testosterone  injections, patient needs to come in for [facility] labs and referral can be sent for endocrinology for management of testosterone  levels/ injections   Or patient can schedule a appointment at [facility] [address] [place], [place], [place] [address]    [phone]

## 2023-08-20 NOTE — Telephone Encounter (Signed)
 States he needs a refill of tadalafil until he is able to get an appt with uology. The other meds he wanted have been sent to [facility] as requested. Pls advise

## 2023-08-21 ENCOUNTER — Other Ambulatory Visit: Payer: Self-pay | Admitting: Family Medicine

## 2023-08-21 MED ORDER — TAMSULOSIN HCL 0.4 MG PO CAPS: 0.4000 mg | ORAL_CAPSULE | Freq: Every day | ORAL | 2 refills | Status: AC

## 2023-08-21 MED ORDER — TADALAFIL 20 MG PO TABS: 20.0000 mg | ORAL_TABLET | ORAL | 2 refills | Status: DC | PRN

## 2023-08-21 MED ORDER — ALLOPURINOL 100 MG PO TABS: 100.0000 mg | ORAL_TABLET | Freq: Every day | ORAL | 1 refills | Status: DC

## 2023-08-21 MED ORDER — ALLOPURINOL 100 MG PO TABS: 100.0000 mg | ORAL_TABLET | Freq: Every day | ORAL | 0 refills | Status: DC

## 2023-08-21 NOTE — Telephone Encounter (Signed)
 SENT

## 2023-08-26 ENCOUNTER — Ambulatory Visit: Admitting: Family Medicine

## 2023-08-30 ENCOUNTER — Encounter: Payer: Self-pay | Admitting: Family Medicine

## 2023-09-01 ENCOUNTER — Other Ambulatory Visit: Payer: Self-pay | Admitting: Family Medicine

## 2023-09-01 DIAGNOSIS — Z7689 Persons encountering health services in other specified circumstances: Secondary | ICD-10-CM

## 2023-09-02 ENCOUNTER — Other Ambulatory Visit: Payer: Self-pay | Admitting: Family Medicine

## 2023-09-04 NOTE — Telephone Encounter (Signed)
 I sent in  Klonopin  on 09/03/23 it was not denied

## 2023-09-07 ENCOUNTER — Other Ambulatory Visit: Payer: Self-pay | Admitting: Internal Medicine

## 2023-09-07 ENCOUNTER — Encounter (HOSPITAL_COMMUNITY): Payer: Self-pay

## 2023-09-07 ENCOUNTER — Other Ambulatory Visit: Payer: Self-pay

## 2023-09-07 DIAGNOSIS — R7303 Prediabetes: Secondary | ICD-10-CM

## 2023-09-07 DIAGNOSIS — E88819 Insulin resistance, unspecified: Secondary | ICD-10-CM

## 2023-09-07 MED ORDER — PANTOPRAZOLE SODIUM 40 MG PO TBEC: 40.0000 mg | DELAYED_RELEASE_TABLET | Freq: Every day | ORAL | 2 refills | Status: DC

## 2023-09-08 ENCOUNTER — Other Ambulatory Visit: Payer: Self-pay | Admitting: Family Medicine

## 2023-09-08 ENCOUNTER — Other Ambulatory Visit: Payer: Self-pay | Admitting: Internal Medicine

## 2023-09-08 DIAGNOSIS — R7303 Prediabetes: Secondary | ICD-10-CM

## 2023-09-08 DIAGNOSIS — E88819 Insulin resistance, unspecified: Secondary | ICD-10-CM

## 2023-09-08 MED ORDER — TIRZEPATIDE-WEIGHT MANAGEMENT 5 MG/0.5ML ~~LOC~~ SOLN: 5.0000 mg | SUBCUTANEOUS | 0 refills | Status: DC

## 2023-09-08 NOTE — Telephone Encounter (Signed)
 Looks like he just needs the next dose of zepbound. Has already completed the 2.5mg .

## 2023-09-09 ENCOUNTER — Telehealth: Payer: Self-pay | Admitting: Pharmacy Technician

## 2023-09-09 ENCOUNTER — Encounter: Payer: Self-pay | Admitting: Family Medicine

## 2023-09-09 ENCOUNTER — Other Ambulatory Visit (HOSPITAL_COMMUNITY): Payer: Self-pay

## 2023-09-09 ENCOUNTER — Other Ambulatory Visit: Payer: Self-pay

## 2023-09-09 NOTE — Telephone Encounter (Signed)
 Pharmacy Patient Advocate Encounter  Received notification from [facility] that Prior Authorization for Zepbound 5MG /0.5ML pen-injectors  has been DENIED.  Full denial letter will be uploaded to the media tab. See denial reason below.   PA #/Case ID/Reference #: Key: [id number]  We will submit an e-appeal for use for OSA.

## 2023-09-09 NOTE — Telephone Encounter (Signed)
 Pharmacy Patient Advocate Encounter   Received notification from CoverMyMeds that prior authorization for Zepbound 5MG /0.5ML pen-injectors is required/requested.   Insurance verification completed.   The patient is insured through [facility] .   Per test claim: PA required; PA submitted to above mentioned insurance via CoverMyMeds Key/confirmation #/EOC [id number] Status is pending

## 2023-09-10 ENCOUNTER — Telehealth: Payer: Self-pay | Admitting: Pharmacist

## 2023-09-10 ENCOUNTER — Other Ambulatory Visit (HOSPITAL_COMMUNITY): Payer: Self-pay

## 2023-09-10 NOTE — Telephone Encounter (Signed)
 Appeal has been submitted for Zepbound. Will advise when response is received, please be advised that most companies may take 30 days to make a decision. Appeal letter and supporting documentation have been faxed to [phone] on 09/10/2023 @9 :20 am.  Thank you, Dene Fines, PharmD Clinical Pharmacist  [place]  Direct Dial: [phone]

## 2023-09-10 NOTE — Telephone Encounter (Signed)
 The appeal for Zepbound has been approved by the insurance:    Thank you, Dene Fines, PharmD Clinical Pharmacist  [place]  Direct Dial: [phone]

## 2023-09-11 NOTE — Telephone Encounter (Signed)
 Sent 1 week ago

## 2023-09-14 ENCOUNTER — Ambulatory Visit: Payer: 59

## 2023-09-14 ENCOUNTER — Other Ambulatory Visit: Payer: Self-pay | Admitting: Internal Medicine

## 2023-09-14 VITALS — Ht 72.0 in | Wt 348.0 lb

## 2023-09-14 DIAGNOSIS — Z7189 Other specified counseling: Secondary | ICD-10-CM

## 2023-09-14 DIAGNOSIS — E291 Testicular hypofunction: Secondary | ICD-10-CM

## 2023-09-14 DIAGNOSIS — Z599 Problem related to housing and economic circumstances, unspecified: Secondary | ICD-10-CM | POA: Diagnosis not present

## 2023-09-14 DIAGNOSIS — Z789 Other specified health status: Secondary | ICD-10-CM

## 2023-09-14 DIAGNOSIS — Z0000 Encounter for general adult medical examination without abnormal findings: Secondary | ICD-10-CM | POA: Diagnosis not present

## 2023-09-14 DIAGNOSIS — Z9289 Personal history of other medical treatment: Secondary | ICD-10-CM

## 2023-09-14 DIAGNOSIS — Z79899 Other long term (current) drug therapy: Secondary | ICD-10-CM

## 2023-09-14 DIAGNOSIS — Z0100 Encounter for examination of eyes and vision without abnormal findings: Secondary | ICD-10-CM

## 2023-09-14 DIAGNOSIS — Z59819 Housing instability, housed unspecified: Secondary | ICD-10-CM | POA: Diagnosis not present

## 2023-09-14 NOTE — Progress Notes (Signed)
 Subjective:   Julian Richardson is a 53 y.o. who presents for a Medicare Wellness preventive visit.  As a reminder, Annual Wellness Visits don't include a physical exam, and some assessments may be limited, especially if this visit is performed virtually. We may recommend an in-person follow-up visit with your provider if needed.  Visit Complete: Virtual I connected with  Julian Richardson on 09/14/23 by a audio enabled telemedicine application and verified that I am speaking with the correct person using two identifiers.  Patient Location: Home  Provider Location: Home Office  I discussed the limitations of evaluation and management by telemedicine. The patient expressed understanding and agreed to proceed.  Vital Signs: Because this visit was a virtual/telehealth visit, some criteria may be missing or patient reported. Any vitals not documented were not able to be obtained and vitals that have been documented are patient reported.  VideoDeclined- This patient declined Librarian, academic. Therefore the visit was completed with audio only.  Persons Participating in Visit: Patient.  AWV Questionnaire: No: Patient Medicare AWV questionnaire was not completed prior to this visit.  Cardiac Risk Factors include: advanced age (>24men, >67 women);male gender;hypertension;obesity (BMI >30kg/m2);sedentary lifestyle     Objective:     Today's Vitals   09/14/23 1454 09/14/23 1456  Weight: (!) 348 lb (157.9 kg)   Height: 6' (1.829 m)   PainSc:  6    Body mass index is 47.2 kg/m.     09/14/2023    3:01 PM 05/23/2023    1:23 PM 03/28/2021   11:11 AM 08/16/2014    7:16 AM 08/04/2014   10:30 AM 02/08/2014    7:45 PM 01/20/2014   11:15 AM  Advanced Directives  Does Patient Have a Medical Advance Directive? No No No No No No No  Would patient like information on creating a medical advance directive? No - Patient declined No - Patient declined No - Patient declined No -  patient declined information No - patient declined information No - patient declined information No - patient declined information    Current Medications (verified) Outpatient Encounter Medications as of 09/14/2023  Medication Sig   acetaminophen  (TYLENOL ) 500 MG tablet Take 1,000 mg by mouth every 6 (six) hours as needed for mild pain.   allopurinol  (ZYLOPRIM ) 300 MG tablet Take 300 mg by mouth daily.   amLODipine  (NORVASC ) 10 MG tablet Take 10 mg by mouth daily.   amphetamine -dextroamphetamine  (ADDERALL) 10 MG tablet Take 10 mg by mouth in the morning, at noon, and at bedtime.   Cholecalciferol (VITAMIN D -3 PO) Take 1 tablet by mouth daily.   clonazePAM  (KLONOPIN ) 0.5 MG tablet TAKE ONE TABLET BY MOUTH ONCE AS NEEDED FOR ANXIETY   gabapentin (NEURONTIN) 400 MG capsule Take 800 mg by mouth at bedtime.   Ginkgo Biloba 40 MG TABS Take 1 tablet by mouth daily.   lisinopril (ZESTRIL) 40 MG tablet Take 40 mg by mouth daily.   metoprolol  succinate (TOPROL -XL) 100 MG 24 hr tablet Take 1 tablet (100 mg total) by mouth daily. Take with or immediately following a meal.   pantoprazole  (PROTONIX ) 40 MG tablet Take 1 tablet (40 mg total) by mouth daily.   spironolactone (ALDACTONE) 25 MG tablet Take 12.5 mg by mouth daily.   tadalafil  (CIALIS ) 20 MG tablet Take 1 tablet (20 mg total) by mouth as needed.   tamsulosin  (FLOMAX ) 0.4 MG CAPS capsule Take 1 capsule (0.4 mg total) by mouth daily.   tirzepatide 5 MG/0.5ML injection vial  Inject 5 mg into the skin once a week.   BOOSTRIX 5-2.5-18.5 LF-MCG/0.5 injection  (Patient not taking: Reported on 09/14/2023)   Coenzyme Q10 (COQ10 PO) Take 1 tablet by mouth daily. (Patient not taking: Reported on 09/14/2023)   hydrocortisone  1 % lotion Apply 1 Application topically 2 (two) times daily. (Patient not taking: Reported on 09/14/2023)   nystatin  cream (MYCOSTATIN ) Apply topically 2 (two) times daily. (Patient not taking: Reported on 09/14/2023)   Omega-3 Fatty Acids  (OMEGA-3 FISH OIL PO) Take 1 capsule by mouth daily. (Patient not taking: Reported on 09/14/2023)   testosterone  cypionate (DEPOTESTOSTERONE CYPIONATE) 200 MG/ML injection SMARTSIG:0.5 Milliliter(s) IM Once a Week (Patient not taking: Reported on 09/14/2023)   venlafaxine XR (EFFEXOR-XR) 150 MG 24 hr capsule Take 150 mg by mouth in the morning and at bedtime. Patient taking twice daily, 300mg  daily.   [DISCONTINUED] allopurinol  (ZYLOPRIM ) 100 MG tablet Take 1 tablet (100 mg total) by mouth daily. (Patient not taking: Reported on 09/14/2023)   [DISCONTINUED] Chlorpheniramine Maleate (ALLERGY PO) Take 1 tablet by mouth daily as needed (allergies).   Facility-Administered Encounter Medications as of 09/14/2023  Medication   methylPREDNISolone  acetate (DEPO-MEDROL ) injection 40 mg    Allergies (verified) Ibuprofen, Morphine  and codeine, Nsaids, Tolmetin, Aspirin, and Reglan  [metoclopramide ]   History: Past Medical History:  Diagnosis Date   ADHD (attention deficit hyperactivity disorder)    Anxiety    Gout    Hypertension    Sleep apnea    Past Surgical History:  Procedure Laterality Date   GASTRIC BYPASS     WISDOM TOOTH EXTRACTION Bilateral    Family History  Problem Relation Age of Onset   Hypertension Mother    Diabetes Mother    Hypertension Father    Hyperlipidemia Father    Heart attack Father    Social History   Socioeconomic History   Marital status: Single    Spouse name: Not on file   Number of children: Not on file   Years of education: Not on file   Highest education level: Bachelor's degree (e.g., BA, AB, BS)  Occupational History   Not on file  Tobacco Use   Smoking status: Never    Passive exposure: Never   Smokeless tobacco: Never  Vaping Use   Vaping status: Never Used  Substance and Sexual Activity   Alcohol  use: No   Drug use: No   Sexual activity: Not Currently  Other Topics Concern   Not on file  Social History Narrative   Not on file    Social Drivers of Health   Financial Resource Strain: High Risk (09/14/2023)   Overall Financial Resource Strain (CARDIA)    Difficulty of Paying Living Expenses: Hard  Food Insecurity: Food Insecurity Present (09/14/2023)   Hunger Vital Sign    Worried About Running Out of Food in the Last Year: Sometimes true    Ran Out of Food in the Last Year: Sometimes true  Transportation Needs: No Transportation Needs (09/14/2023)   PRAPARE - Administrator, Civil Service (Medical): No    Lack of Transportation (Non-Medical): No  Physical Activity: Sufficiently Active (09/14/2023)   Exercise Vital Sign    Days of Exercise per Week: 7 days    Minutes of Exercise per Session: 30 min  Stress: Stress Concern Present (09/14/2023)   [employer] of Occupational Health - Occupational Stress Questionnaire    Feeling of Stress : Rather much  Social Connections: Moderately Isolated (09/14/2023)   Social Connection  and Isolation Panel [NHANES]    Frequency of Communication with Friends and Family: Three times a week    Frequency of Social Gatherings with Friends and Family: More than three times a week    Attends Religious Services: More than 4 times per year    Active Member of [employer] or Organizations: No    Attends Engineer, structural: Never    Marital Status: Never married    Tobacco Counseling Counseling given: Yes    Clinical Intake:  Pre-visit preparation completed: Yes  Pain : 0-10 Pain Score: 6  Pain Type: Acute pain Pain Location: Abdomen Pain Orientation: Upper Pain Descriptors / Indicators: Cramping Pain Onset: 1 to 4 weeks ago (started when he started Zepbound) Pain Frequency: Intermittent     BMI - recorded: 47.2 Nutritional Status: BMI > 30  Obese Nutritional Risks: None Diabetes: No  Lab Results  Component Value Date   HGBA1C 5.7 (H) 06/11/2023   HGBA1C 6.2 (H) 02/03/2023   HGBA1C 5.6 07/25/2021     How often do you need to have someone  help you when you read instructions, pamphlets, or other written materials from your doctor or pharmacy?: 1 - Never  Interpreter Needed?: No  Information entered by :: Sally Crazier CMA   Activities of Daily Living     09/14/2023    2:58 PM  In your present state of health, do you have any difficulty performing the following activities:  Hearing? 0  Vision? 0  Difficulty concentrating or making decisions? 0  Walking or climbing stairs? 0  Dressing or bathing? 0  Doing errands, shopping? 0  Preparing Food and eating ? N  Using the Toilet? N  In the past six months, have you accidently leaked urine? N  Do you have problems with loss of bowel control? N  Managing your Medications? N  Managing your Finances? N  Housekeeping or managing your Housekeeping? N    Patient Care Team: Del Amber Bail, Rogerio Clay, FNP as PCP - General (Family Medicine)  Indicate any recent Medical Services you may have received from other than [facility] providers in the past year (date may be approximate).     Assessment:    This is a routine wellness examination for Maika.  Hearing/Vision screen Hearing Screening - Comments:: Patient denies any hearing difficulties.   Vision Screening - Comments:: Patient is not up to date on yearly eye exams.  Referral to ophthalmology placed.     Goals Addressed             This Visit's Progress    Patient Stated       Get my medications on the right tract and where they are supposed to be.        Depression Screen     09/14/2023    3:05 PM 06/11/2023    9:14 AM 03/12/2023    2:30 PM 02/03/2023    9:57 AM  PHQ 2/9 Scores  PHQ - 2 Score 4 0 1 1  PHQ- 9 Score 9 0 7 7    Fall Risk     09/14/2023    3:02 PM 06/11/2023    9:14 AM 02/03/2023    9:56 AM  Fall Risk   Falls in the past year? 0 0 0  Number falls in past yr: 0 0 0  Injury with Fall? 0 0 0  Risk for fall due to : No Fall Risks No Fall Risks No Fall Risks  Follow up Falls prevention  discussed;Falls evaluation completed Falls evaluation completed Falls evaluation completed    MEDICARE RISK AT HOME:  Medicare Risk at Home Any stairs in or around the home?: Yes If so, are there any without handrails?: No Home free of loose throw rugs in walkways, pet beds, electrical cords, etc?: No (loose base board and landlord doesn't want to repair it) Adequate lighting in your home to reduce risk of falls?: No (living has no lights) Life alert?: No Use of a cane, walker or w/c?: No Grab bars in the bathroom?: No Shower chair or bench in shower?: No Elevated toilet seat or a handicapped toilet?: No  TIMED UP AND GO:  Was the test performed?  No  Cognitive Function: 6CIT completed        09/14/2023    3:05 PM  6CIT Screen  What Year? 0 points  What month? 0 points  What time? 0 points  Count back from 20 0 points  Months in reverse 0 points  Repeat phrase 0 points  Total Score 0 points    Immunizations Immunization History  Administered Date(s) Administered   Tdap 10/08/2021    Screening Tests Health Maintenance  Topic Date Due   COVID-19 Vaccine (1) Never done   Pneumococcal Vaccine 61-56 Years old (1 of 2 - PCV) Never done   Zoster Vaccines- Shingrix (1 of 2) Never done   INFLUENZA VACCINE  11/27/2023   Medicare Annual Wellness (AWV)  09/13/2024   Fecal DNA (Cologuard)  02/23/2026   DTaP/Tdap/Td (2 - Td or Tdap) 10/09/2031   Hepatitis C Screening  Completed   HIV Screening  Completed   HPV VACCINES  Aged Out   Meningococcal B Vaccine  Aged Out   Colonoscopy  Discontinued    Health Maintenance  Health Maintenance Due  Topic Date Due   COVID-19 Vaccine (1) Never done   Pneumococcal Vaccine 71-80 Years old (1 of 2 - PCV) Never done   Zoster Vaccines- Shingrix (1 of 2) Never done   Health Maintenance Items Addressed: Referral sent to Optometry/Ophthalmology. Referral to pharmacy placed to assist patient with switching to a [facility]. Patient is  in agreement with treatment plan. Referral to pulmonologist placed again to update sleep study.   Additional Screening:  Vision Screening: Recommended annual ophthalmology exams for early detection of glaucoma and other disorders of the eye.  Dental Screening: Recommended annual dental exams for proper oral hygiene  Community Resource Referral / Chronic Care Management: CRR required this visit?  Yes   CCM required this visit?  No   Plan:    I have personally reviewed and noted the following in the patient's chart:   Medical and social history Use of alcohol , tobacco or illicit drugs  Current medications and supplements including opioid prescriptions. Patient is not currently taking opioid prescriptions. Functional ability and status Nutritional status Physical activity Advanced directives List of other physicians Hospitalizations, surgeries, and ER visits in previous 12 months Vitals Screenings to include cognitive, depression, and falls Referrals and appointments  In addition, I have reviewed and discussed with patient certain preventive protocols, quality metrics, and best practice recommendations. A written personalized care plan for preventive services as well as general preventive health recommendations were provided to patient.   Nelia Rogoff, CMA   09/14/2023   After Visit Summary: (MyChart) Due to this being a telephonic visit, the after visit summary with patients personalized plan was offered to patient via MyChart   Notes: Please refer to Routing Comments.

## 2023-09-14 NOTE — Patient Instructions (Signed)
 [name] , Thank you for taking time out of your busy schedule to complete your Annual Wellness Visit with me. I enjoyed our conversation and look forward to speaking with you again next year. I, as well as your care team,  appreciate your ongoing commitment to your health goals. Please review the following plan we discussed and let me know if I can assist you in the future.  Your Game plan/ To Do List     Referrals: If you haven't heard from the office you've been referred to, please reach out to them at the phone number provided.   [x]  You've been referred to [facility]. You can call them to schedule your appointment Address: [address], [place], [place] [address] Phone: [phone]             [x]  A referral has been placed for you to check and see what additional resources are available to you.   If you haven't heard from anyone within the next 7 business days, please call them and let them know a referral has been placed for you Phone: [phone]  [x]  You have been referred to have a low dose lung cancer screening test. If you haven't heard from them in a week please call one of the numbers listed below.  [facility] [address] [place] Phone:  [phone]  Follow up Visits:  Next Medicare AWV with our clinical staff:   Sep 14, 2024 at 8:40 am video visit  Have you seen your provider in the last 6 months (3 months if uncontrolled diabetes)? Yes  Next Office Visit with your provider:   October 02, 2023 with Julian Richardson  Clinician Recommendations:    Aim for 30 minutes of exercise or brisk walking, 6-8 glasses of water, and 5 servings of fruits and vegetables each day.   I enjoyed our conversation today and look forward to talking with you again next year!! Have a wonderful and safe year. All the best, Julian Richardson      This is a list of the screening recommended for you and due dates:  Health Maintenance  Topic Date Due   COVID-19 Vaccine (1) Never  done   Pneumococcal Vaccination (1 of 2 - PCV) Never done   Zoster (Shingles) Vaccine (1 of 2) Never done   Flu Shot  11/27/2023   Medicare Annual Wellness Visit  09/13/2024   Cologuard (Stool DNA test)  02/23/2026   DTaP/Tdap/Td vaccine (2 - Td or Tdap) 10/09/2031   Hepatitis C Screening  Completed   HIV Screening  Completed   HPV Vaccine  Aged Out   Meningitis B Vaccine  Aged Out   Colon Cancer Screening  Discontinued    Advanced directives: (Declined) Advance directive discussed with you today. Even though you declined this today, please call our office should you change your mind, and we can give you the proper paperwork for you to fill out. Advance Care Planning is important because it:  [x]  Makes sure you receive the medical care that is consistent with your values, goals, and preferences  [x]  It provides guidance to your family and loved ones and reduces their decisional burden about whether or not they are making the right decisions based on your wishes.  Follow the link provided in your after visit summary or read over the paperwork we have mailed to you to help you started getting your Advance Directives in place. If you need assistance in completing these,  please reach out to us  so that we can help you!  See attachments for Preventive Care and Fall Prevention Tips.   Understanding Your Risk for Falls  Millions of people have serious injuries from falls each year. It is important to understand your risk of falling. Talk with your health care provider about your risk and what you can do to lower it. If you do have a serious fall, make sure to tell your provider. Falling once raises your risk of falling again. How can falls affect me? Serious injuries from falls are common. These include: Broken bones, such as hip fractures. Head injuries, such as traumatic brain injuries (TBI) or concussions. A fear of falling can cause you to avoid activities and stay at home. This can make  your muscles weaker and raise your risk for a fall. What can increase my risk? There are a number of risk factors that increase your risk for falling. The more risk factors you have, the higher your risk of falling. Serious injuries from a fall happen most often to people who are older than 52 years old. Teenagers and young adults ages 23-29 are also at higher risk. Common risk factors include: Weakness in the lower body. Being generally weak or confused due to long-term (chronic) illness. Dizziness or balance problems. Poor vision. Medicines that cause dizziness or drowsiness. These may include: Medicines for your blood pressure, heart, anxiety, insomnia, or swelling (edema). Pain medicines. Muscle relaxants. Other risk factors include: Drinking alcohol . Having had a fall in the past. Having foot pain or wearing improper footwear. Working at a dangerous job. Having any of the following in your home: Tripping hazards, such as floor clutter or loose rugs. Poor lighting. Pets. Having dementia or memory loss. What actions can I take to lower my risk of falling?     Physical activity Stay physically fit. Do strength and balance exercises. Consider taking a regular class to build strength and balance. Yoga and tai chi are good options. Vision Have your eyes checked every year and your prescription for glasses or contacts updated as needed. Shoes and walking aids Wear non-skid shoes. Wear shoes that have rubber soles and low heels. Do not wear high heels. Do not walk around the house in socks or slippers. Use a cane or walker as told by your provider. Home safety Attach secure railings on both sides of your stairs. Install grab bars for your bathtub, shower, and toilet. Use a non-skid mat in your bathtub or shower. Attach bath mats securely with double-sided, non-slip rug tape. Use good lighting in all rooms. Keep a flashlight near your bed. Make sure there is a clear path from your  bed to the bathroom. Use night-lights. Do not use throw rugs. Make sure all carpeting is taped or tacked down securely. Remove all clutter from walkways and stairways, including extension cords. Repair uneven or broken steps and floors. Avoid walking on icy or slippery surfaces. Walk on the grass instead of on icy or slick sidewalks. Use ice melter to get rid of ice on walkways in the winter. Use a cordless phone. Questions to ask your health care provider Can you help me check my risk for a fall? Do any of my medicines make me more likely to fall? Should I take a vitamin D  supplement? What exercises can I do to improve my strength and balance? Should I make an appointment to have my vision checked? Do I need a bone density test to check for weak  bones (osteoporosis)? Would it help to use a cane or a walker? Where to find more information Centers for Disease Control and Prevention, STEADI: TonerPromos.no Community-Based Fall Prevention Programs: TonerPromos.no [employer] on Aging: BaseRingTones.pl Contact a health care provider if: You fall at home. You are afraid of falling at home. You feel weak, drowsy, or dizzy. This information is not intended to replace advice given to you by your health care provider. Make sure you discuss any questions you have with your health care provider. Document Revised: 12/16/2021 Document Reviewed: 12/16/2021 Elsevier Patient Education  2024 [employer]. Understanding Your Risk for Falls Millions of people have serious injuries from falls each year. It is important to understand your risk of falling. Talk with your health care provider about your risk and what you can do to lower it. If you do have a serious fall, make sure to tell your provider. Falling once raises your risk of falling again. How can falls affect me? Serious injuries from falls are common. These include: Broken bones, such as hip fractures. Head injuries, such as traumatic brain injuries (TBI)  or concussions. A fear of falling can cause you to avoid activities and stay at home. This can make your muscles weaker and raise your risk for a fall. What can increase my risk? There are a number of risk factors that increase your risk for falling. The more risk factors you have, the higher your risk of falling. Serious injuries from a fall happen most often to people who are older than 52 years old. Teenagers and young adults ages 34-29 are also at higher risk. Common risk factors include: Weakness in the lower body. Being generally weak or confused due to long-term (chronic) illness. Dizziness or balance problems. Poor vision. Medicines that cause dizziness or drowsiness. These may include: Medicines for your blood pressure, heart, anxiety, insomnia, or swelling (edema). Pain medicines. Muscle relaxants. Other risk factors include: Drinking alcohol . Having had a fall in the past. Having foot pain or wearing improper footwear. Working at a dangerous job. Having any of the following in your home: Tripping hazards, such as floor clutter or loose rugs. Poor lighting. Pets. Having dementia or memory loss. What actions can I take to lower my risk of falling?     Physical activity Stay physically fit. Do strength and balance exercises. Consider taking a regular class to build strength and balance. Yoga and tai chi are good options. Vision Have your eyes checked every year and your prescription for glasses or contacts updated as needed. Shoes and walking aids Wear non-skid shoes. Wear shoes that have rubber soles and low heels. Do not wear high heels. Do not walk around the house in socks or slippers. Use a cane or walker as told by your provider. Home safety Attach secure railings on both sides of your stairs. Install grab bars for your bathtub, shower, and toilet. Use a non-skid mat in your bathtub or shower. Attach bath mats securely with double-sided, non-slip rug tape. Use  good lighting in all rooms. Keep a flashlight near your bed. Make sure there is a clear path from your bed to the bathroom. Use night-lights. Do not use throw rugs. Make sure all carpeting is taped or tacked down securely. Remove all clutter from walkways and stairways, including extension cords. Repair uneven or broken steps and floors. Avoid walking on icy or slippery surfaces. Walk on the grass instead of on icy or slick sidewalks. Use ice melter to get rid of ice  on walkways in the winter. Use a cordless phone. Questions to ask your health care provider Can you help me check my risk for a fall? Do any of my medicines make me more likely to fall? Should I take a vitamin D  supplement? What exercises can I do to improve my strength and balance? Should I make an appointment to have my vision checked? Do I need a bone density test to check for weak bones (osteoporosis)? Would it help to use a cane or a walker? Where to find more information Centers for Disease Control and Prevention, STEADI: TonerPromos.no Community-Based Fall Prevention Programs: TonerPromos.no [employer] on Aging: BaseRingTones.pl Contact a health care provider if: You fall at home. You are afraid of falling at home. You feel weak, drowsy, or dizzy. This information is not intended to replace advice given to you by your health care provider. Make sure you discuss any questions you have with your health care provider. Document Revised: 12/16/2021 Document Reviewed: 12/16/2021 Elsevier Patient Education  2024 [employer].

## 2023-09-15 ENCOUNTER — Other Ambulatory Visit (HOSPITAL_COMMUNITY): Payer: Self-pay

## 2023-09-15 ENCOUNTER — Telehealth: Payer: Self-pay

## 2023-09-15 ENCOUNTER — Other Ambulatory Visit: Payer: Self-pay

## 2023-09-15 MED ORDER — EZETIMIBE 10 MG PO TABS
10.0000 mg | ORAL_TABLET | Freq: Every day | ORAL | 1 refills | Status: DC
  Filled 2023-09-15: qty 30, 30d supply, fill #0

## 2023-09-15 MED ORDER — ROSUVASTATIN CALCIUM 40 MG PO TABS
40.0000 mg | ORAL_TABLET | Freq: Every day | ORAL | 2 refills | Status: DC
  Filled 2023-09-15: qty 90, 90d supply, fill #0

## 2023-09-15 MED ORDER — ORAJEL 3X TOOTHACHE & GUM 20-0.26-0.15 % MT GEL
1.0000 | Freq: Every day | OROMUCOSAL | 1 refills | Status: DC | PRN
  Filled 2023-09-15: qty 11.9, 30d supply, fill #0

## 2023-09-15 MED ORDER — BUSPIRONE HCL 15 MG PO TABS
15.0000 mg | ORAL_TABLET | Freq: Two times a day (BID) | ORAL | 2 refills | Status: DC
  Filled 2023-09-15: qty 60, 30d supply, fill #0
  Filled 2023-10-12: qty 60, 30d supply, fill #1
  Filled 2023-11-21: qty 60, 30d supply, fill #2

## 2023-09-15 MED ORDER — ROSUVASTATIN CALCIUM 40 MG PO TABS
40.0000 mg | ORAL_TABLET | Freq: Every day | ORAL | 2 refills | Status: DC
  Filled 2023-09-15 (×2): qty 90, 90d supply, fill #0

## 2023-09-15 MED ORDER — ROSUVASTATIN CALCIUM 40 MG PO TABS
40.0000 mg | ORAL_TABLET | Freq: Every day | ORAL | 3 refills | Status: DC
  Filled 2023-09-15: qty 90, 90d supply, fill #0

## 2023-09-15 MED ORDER — ALLOPURINOL 100 MG PO TABS
100.0000 mg | ORAL_TABLET | Freq: Every day | ORAL | 1 refills | Status: DC
  Filled 2023-09-15 – 2023-10-01 (×4): qty 90, 90d supply, fill #0
  Filled 2023-12-25: qty 90, 90d supply, fill #1

## 2023-09-15 MED ORDER — ALLOPURINOL 100 MG PO TABS
100.0000 mg | ORAL_TABLET | Freq: Every day | ORAL | 0 refills | Status: DC
Start: 2023-08-21 — End: 2024-03-10
  Filled 2023-09-15: qty 90, 90d supply, fill #0

## 2023-09-15 MED ORDER — NYSTATIN 100000 UNIT/GM EX CREA
1.0000 | TOPICAL_CREAM | Freq: Two times a day (BID) | CUTANEOUS | 1 refills | Status: AC
  Filled 2023-09-15 – 2023-09-16 (×2): qty 30, 15d supply, fill #0
  Filled 2023-09-26 – 2023-10-26 (×3): qty 30, 15d supply, fill #1

## 2023-09-15 MED ORDER — TAMSULOSIN HCL 0.4 MG PO CAPS
0.4000 mg | ORAL_CAPSULE | Freq: Every day | ORAL | 0 refills | Status: DC
  Filled 2023-09-15: qty 90, 90d supply, fill #0

## 2023-09-15 MED ORDER — PANTOPRAZOLE SODIUM 40 MG PO TBEC
40.0000 mg | DELAYED_RELEASE_TABLET | Freq: Every day | ORAL | 1 refills | Status: DC
  Filled 2023-09-15 – 2023-10-01 (×4): qty 30, 30d supply, fill #0
  Filled 2023-10-26: qty 30, 30d supply, fill #1

## 2023-09-15 MED ORDER — TADALAFIL 20 MG PO TABS
20.0000 mg | ORAL_TABLET | Freq: Every day | ORAL | 1 refills | Status: DC | PRN
  Filled 2023-09-15 – 2023-10-28 (×2): qty 10, 10d supply, fill #0
  Filled 2023-11-26: qty 10, 10d supply, fill #1

## 2023-09-15 MED ORDER — METOPROLOL SUCCINATE ER 100 MG PO TB24
100.0000 mg | ORAL_TABLET | ORAL | 3 refills | Status: DC
  Filled 2023-09-15 – 2023-12-07 (×2): qty 90, 90d supply, fill #0

## 2023-09-15 MED ORDER — HYDROCORTISONE 1 % EX LOTN
1.0000 | TOPICAL_LOTION | Freq: Two times a day (BID) | CUTANEOUS | 0 refills | Status: DC
  Filled 2023-09-15 – 2023-09-26 (×4): qty 120, 12d supply, fill #0

## 2023-09-15 NOTE — Progress Notes (Signed)
 Complex Care Management Note  Care Guide Note 09/15/2023 Name: Julian Richardson MRN: [id number] DOB: 12-Feb-1971  Julian Richardson is a 53 y.o. year old male who sees Del Orlandria Kissner Bail, Rogerio Clay, FNP for primary care. I reached out to [name] by phone today to offer complex care management services.  Julian Richardson was given information about Complex Care Management services today including:   The Complex Care Management services include support from the care team which includes your Nurse Care Manager, Clinical Social Worker, or Pharmacist.  The Complex Care Management team is here to help remove barriers to the health concerns and goals most important to you. Complex Care Management services are voluntary, and the patient may decline or stop services at any time by request to their care team member.   Complex Care Management Consent Status: Patient agreed to services and verbal consent obtained.   Follow up plan:  Telephone appointment with complex care management team member scheduled for:  10/08/2023  Encounter Outcome:  Patient Scheduled  Lenton Rail , RMA     [place]  [facility], [facility] Guide  Direct Dial: [phone]  Website: [email or web address]

## 2023-09-15 NOTE — Progress Notes (Signed)
 Care Guide Pharmacy Note  09/15/2023 Name: Julian Richardson MRN: [id number] DOB: 23-May-1970  Referred By: Rosanna Comment, FNP Reason for referral: Complex Care Management (Outreach to schedule with RNCM and Pharm d )   Julian Richardson is a 53 y.o. year old male who is a primary care patient of Julian Abron Abt, FNP.  Julian Richardson was referred to the pharmacist for assistance related to: HTN and HLD  Successful contact was made with the patient to discuss pharmacy services including being ready for the pharmacist to call at least 5 minutes before the scheduled appointment time and to have medication bottles and any blood pressure readings ready for review. The patient agreed to meet with the pharmacist via telephone visit on (date/time).09/18/2023  Julian Richardson , RMA     [place]  [facility], [facility] Guide  Direct Dial: [phone]  Website: [email or web address]

## 2023-09-16 ENCOUNTER — Telehealth: Payer: Self-pay

## 2023-09-16 ENCOUNTER — Other Ambulatory Visit: Payer: Self-pay | Admitting: Family Medicine

## 2023-09-16 ENCOUNTER — Other Ambulatory Visit: Payer: Self-pay

## 2023-09-16 ENCOUNTER — Other Ambulatory Visit (HOSPITAL_COMMUNITY): Payer: Self-pay

## 2023-09-16 MED ORDER — VENLAFAXINE HCL ER 150 MG PO CP24
300.0000 mg | ORAL_CAPSULE | Freq: Every day | ORAL | 1 refills | Status: DC
Start: 2023-07-30 — End: 2023-09-24
  Filled 2023-09-16 – 2023-09-17 (×3): qty 60, 30d supply, fill #0

## 2023-09-16 MED ORDER — GABAPENTIN 400 MG PO CAPS
800.0000 mg | ORAL_CAPSULE | Freq: Every day | ORAL | 1 refills | Status: DC
  Filled 2023-09-16 – 2023-09-17 (×3): qty 60, 30d supply, fill #0
  Filled 2023-10-12: qty 60, 30d supply, fill #1

## 2023-09-16 NOTE — Progress Notes (Signed)
   Telephone encounter was:  Successful.  Complex Care Management Note Care Guide Note  09/16/2023 Name: Manasseh Pittsley MRN: [id number] DOB: 06-18-1970  Ridgely Anastacio is a 53 y.o. year old male who is a primary care patient of Del Abron Abt, FNP . The community resource team was consulted for assistance with Food Insecurity, Financial Difficulties related to financial strain, and Housing   SDOH screenings and interventions completed:  Yes        Care guide performed the following interventions: Patient provided with information about care guide support team and interviewed to confirm resource needs.Email: [email or web address]. PT is needing assistance with financial, housing and utility assistance for [facility]. Pt requested I email the resources   Follow Up Plan:  No further follow up planned at this time. The patient has been provided with needed resources.  Encounter Outcome:  Patient Visit Completed    Azell Leopard [facility]  [facility] Guide, Phone: [phone] Fax: [phone] Website: [email or web address]

## 2023-09-16 NOTE — Telephone Encounter (Signed)
 Copied from CRM [id number]. Topic: Clinical - Medication Question >> Sep 16, 2023 10:13 AM Oddis Bench wrote: Reason for CRM: Lucio Sabin with [facility] out patient is calling bc the patient is wanting to switch his amphetamine -dextroamphetamine  (ADDERALL) 10 MG tablet switched to this pharmacy, it was not able to be switched with his other meds so will need a new script sent. [phone] fax number. [address] [place], [place] [address]

## 2023-09-16 NOTE — Telephone Encounter (Signed)
 Patient needs to contact their previous provider, Dr. Pinkie Brigham A. Akintayo, to request a refill of Adderall, as he was the last prescriber of this medication

## 2023-09-17 ENCOUNTER — Other Ambulatory Visit: Payer: Self-pay

## 2023-09-17 ENCOUNTER — Ambulatory Visit: Payer: Self-pay | Admitting: Internal Medicine

## 2023-09-17 ENCOUNTER — Other Ambulatory Visit (HOSPITAL_COMMUNITY): Payer: Self-pay

## 2023-09-17 ENCOUNTER — Encounter (HOSPITAL_COMMUNITY): Payer: Self-pay

## 2023-09-17 NOTE — Telephone Encounter (Signed)
 Patient has reached out to prescribing office

## 2023-09-18 ENCOUNTER — Other Ambulatory Visit: Payer: Self-pay

## 2023-09-18 NOTE — Progress Notes (Signed)
   09/18/2023 Name: Jurell Basista MRN: [id number] DOB: 02-23-1971  Chief Complaint  Patient presents with   Medication Management    Rodel Glaspy is a 53 y.o. year old male who presented for a telephone visit.   They were referred to the pharmacist by their PCP for assistance in managing complex medication management.    Subjective:  Care Team: Primary Care Provider: Rosanna Comment, FNP ; Next Scheduled Visit:   Future Appointments  Date Time Provider Department Center  10/02/2023  4:00 PM Del Abron Abt, [place] RPC-RPC [facility]  10/08/2023  2:00 PM Remona Carmel, RN CHL-POPH None  12/02/2023  3:20 PM Mallipeddi, Kennyth Pean, MD [facility]  09/14/2024  8:40 AM RPC-ANNUAL WELLNESS VISIT RPC-RPC RPC   Medication Access/Adherence  Current Pharmacy:  [facility] - [facility] [address] [place] [address] Phone: [phone] Fax: [phone]  [facility] [place], [place] - [address] [place] [address] Phone: [phone] Fax: [phone]  [facility] [address] [place] [address] [place] [address] [place] [address] [place] [address] Phone: [phone] Fax: [phone]   Patient reports affordability concerns with their medications: No  Patient reports access/transportation concerns to their pharmacy: No  Patient reports adherence concerns with their medications:  No  - other than pharmacy issues.     Objective:  Lab Results  Component Value Date   HGBA1C 5.7 (H) 06/11/2023    Lab Results  Component Value Date   CREATININE 1.11 06/11/2023   BUN 12 06/11/2023   NA 143 06/11/2023   K 4.1 06/11/2023   CL 103 06/11/2023   CO2 26 06/11/2023    Lab Results  Component Value Date   CHOL 180 02/03/2023   HDL 39 (L) 02/03/2023   LDLCALC 112 (H) 02/03/2023   TRIG 161 (H) 02/03/2023   CHOLHDL 4.6 02/03/2023    Assessment/Plan:   Medication Management: - Currently  strategy sufficient to maintain appropriate adherence to prescribed medication regimen - Reviewed pharmacy concerns - currently using [facility] for most of his medications but is having to fill adderall through [facility] at this time.  - Reviewed questions related to venlafaxine, buspar , gabapentin among other meds - no notable medication related interactions present. Can always start with lower doses of buspar  and gabapentin to assess tolerability - Discussed possibly considering new psychiatrist if patient prefers - plans to review [employer] dual plan once switching back over 09/27/2023 (current [employer]) - Counseled on zepbound expectations, primarily potential for transient GI side side effects but if getting sick should call the office.    Follow Up Plan: will follow-up as needed per patient/PCP request.   Rolando Cliche, PharmD, BCGP Clinical Pharmacist  [phone]

## 2023-09-24 ENCOUNTER — Other Ambulatory Visit: Payer: Self-pay

## 2023-09-24 ENCOUNTER — Other Ambulatory Visit (HOSPITAL_COMMUNITY): Payer: Self-pay

## 2023-09-24 MED ORDER — GABAPENTIN 400 MG PO CAPS
800.0000 mg | ORAL_CAPSULE | Freq: Every day | ORAL | 1 refills | Status: DC
  Filled 2023-09-24 – 2023-11-09 (×3): qty 60, 30d supply, fill #0
  Filled 2023-12-11 (×2): qty 60, 30d supply, fill #1

## 2023-09-24 MED ORDER — AMPHETAMINE-DEXTROAMPHETAMINE 10 MG PO TABS
10.0000 mg | ORAL_TABLET | Freq: Three times a day (TID) | ORAL | 0 refills | Status: DC
  Filled 2023-09-24 – 2023-10-17 (×4): qty 90, 30d supply, fill #0
  Filled ????-??-??: fill #0

## 2023-09-24 MED ORDER — VENLAFAXINE HCL ER 150 MG PO CP24
300.0000 mg | ORAL_CAPSULE | Freq: Every day | ORAL | 1 refills | Status: DC
  Filled 2023-09-24 – 2023-10-12 (×2): qty 60, 30d supply, fill #0
  Filled 2023-11-07 – 2023-11-09 (×3): qty 60, 30d supply, fill #1

## 2023-09-26 ENCOUNTER — Other Ambulatory Visit: Payer: Self-pay

## 2023-09-26 ENCOUNTER — Other Ambulatory Visit (HOSPITAL_COMMUNITY): Payer: Self-pay

## 2023-09-29 ENCOUNTER — Telehealth: Payer: Self-pay

## 2023-09-29 NOTE — Progress Notes (Signed)
 Complex Care Management Note Care Guide Note  09/29/2023 Name: Herschell Virani MRN: [id number] DOB: 04/03/1971   Complex Care Management Outreach Attempts: An unsuccessful telephone outreach was attempted today to offer the patient information about available complex care management services.  Follow Up Plan:  Additional outreach attempts will be made to offer the patient complex care management information and services.   Encounter Outcome:  No Answer  Lenton Rail , RMA     [place]  [facility], [facility] Guide  Direct Dial: [phone]  Website: [email or web address]

## 2023-09-30 NOTE — Progress Notes (Signed)
 Complex Care Management Note Care Guide Note  09/30/2023 Name: Faron Tudisco MRN: [id number] DOB: 05-Jul-1970   Complex Care Management Outreach Attempts: A second unsuccessful outreach was attempted today to offer the patient with information about available complex care management services.  Follow Up Plan:  Additional outreach attempts will be made to offer the patient complex care management information and services.   Encounter Outcome:  No Answer  Lenton Rail , RMA     [place]  [facility], [facility] Guide  Direct Dial: [phone]  Website: [email or web address]

## 2023-10-01 ENCOUNTER — Other Ambulatory Visit (HOSPITAL_COMMUNITY): Payer: Self-pay

## 2023-10-01 ENCOUNTER — Other Ambulatory Visit: Payer: Self-pay

## 2023-10-02 ENCOUNTER — Ambulatory Visit: Admitting: Family Medicine

## 2023-10-06 NOTE — Progress Notes (Signed)
 Complex Care Management Note Care Guide Note  10/06/2023 Name: Julian Richardson MRN: [id number] DOB: 10-13-1970   Complex Care Management Outreach Attempts: A third unsuccessful outreach was attempted today to offer the patient with information about available complex care management services.  Follow Up Plan:  No further outreach attempts will be made at this time. We have been unable to contact the patient to offer or enroll patient in complex care management services.  Encounter Outcome:  No Answer  Lenton Rail , RMA     [place]  [facility], [facility] Guide  Direct Dial: [phone]  Website: [email or web address]

## 2023-10-07 ENCOUNTER — Encounter: Payer: Self-pay | Admitting: Family Medicine

## 2023-10-08 ENCOUNTER — Other Ambulatory Visit: Payer: Self-pay | Admitting: Internal Medicine

## 2023-10-08 ENCOUNTER — Other Ambulatory Visit: Payer: Self-pay | Admitting: Family Medicine

## 2023-10-08 ENCOUNTER — Telehealth: Payer: Self-pay | Admitting: *Deleted

## 2023-10-08 DIAGNOSIS — I10 Essential (primary) hypertension: Secondary | ICD-10-CM

## 2023-10-08 DIAGNOSIS — R7303 Prediabetes: Secondary | ICD-10-CM

## 2023-10-08 DIAGNOSIS — Z79899 Other long term (current) drug therapy: Secondary | ICD-10-CM

## 2023-10-08 DIAGNOSIS — Z599 Problem related to housing and economic circumstances, unspecified: Secondary | ICD-10-CM

## 2023-10-08 DIAGNOSIS — G4733 Obstructive sleep apnea (adult) (pediatric): Secondary | ICD-10-CM

## 2023-10-08 MED ORDER — TIRZEPATIDE-WEIGHT MANAGEMENT 7.5 MG/0.5ML ~~LOC~~ SOLN: 7.5000 mg | SUBCUTANEOUS | 0 refills | Status: DC

## 2023-10-08 MED ORDER — CLONAZEPAM 0.5 MG PO TABS: 0.5000 mg | ORAL_TABLET | Freq: Every day | ORAL | 0 refills | Status: DC | PRN

## 2023-10-08 MED ORDER — OMEPRAZOLE 40 MG PO CPDR
40.0000 mg | DELAYED_RELEASE_CAPSULE | Freq: Every day | ORAL | 3 refills | Status: DC
Start: 2023-10-08 — End: 2023-12-09

## 2023-10-08 MED ORDER — SPIRONOLACTONE 25 MG PO TABS: 25.0000 mg | ORAL_TABLET | Freq: Every day | ORAL | 3 refills | Status: DC

## 2023-10-08 NOTE — Telephone Encounter (Signed)
 Refills sent to [facility] - patient can make an appointment when he can

## 2023-10-12 ENCOUNTER — Other Ambulatory Visit: Payer: Self-pay

## 2023-10-12 ENCOUNTER — Other Ambulatory Visit (HOSPITAL_COMMUNITY): Payer: Self-pay

## 2023-10-15 ENCOUNTER — Other Ambulatory Visit (HOSPITAL_COMMUNITY): Payer: Self-pay

## 2023-10-17 ENCOUNTER — Other Ambulatory Visit (HOSPITAL_COMMUNITY): Payer: Self-pay

## 2023-10-20 ENCOUNTER — Telehealth: Payer: Self-pay

## 2023-10-20 NOTE — Progress Notes (Signed)
 Complex Care Management Care Guide Note  10/20/2023 Name: Julian Richardson MRN: [id number] DOB: 05/19/1970  Julian Richardson is a 53 y.o. year old male who is a primary care patient of Del Wilhelmena Lloyd Sola, FNP and is actively engaged with the care management team. I reached out to Sherwood Bathe by phone today to assist with re-scheduling  with the RN Case Manager.  Follow up plan: Unsuccessful telephone outreach attempt made. A HIPAA compliant phone message was left for the patient providing contact information and requesting a return call.  Jeoffrey Buffalo , RMA     [facility]  [facility], [facility] Guide  Direct Dial: [phone]  Website: [email or web address]

## 2023-10-26 ENCOUNTER — Other Ambulatory Visit: Payer: Self-pay

## 2023-10-26 ENCOUNTER — Other Ambulatory Visit (HOSPITAL_COMMUNITY): Payer: Self-pay

## 2023-10-28 ENCOUNTER — Other Ambulatory Visit: Payer: Self-pay

## 2023-10-28 ENCOUNTER — Other Ambulatory Visit (HOSPITAL_COMMUNITY): Payer: Self-pay

## 2023-10-29 ENCOUNTER — Other Ambulatory Visit: Payer: Self-pay

## 2023-10-29 ENCOUNTER — Ambulatory Visit: Admitting: Family Medicine

## 2023-10-29 ENCOUNTER — Other Ambulatory Visit (HOSPITAL_COMMUNITY): Payer: Self-pay

## 2023-10-29 NOTE — Progress Notes (Signed)
 Complex Care Management Care Guide Note  10/29/2023 Name: [name] MRN: [id number] DOB: November 20, 1970  Julian Richardson is a 53 y.o. year old male who is a primary care patient of Del Wilhelmena Lloyd Sola, FNP and is actively engaged with the care management team. I reached out to Sherwood Bathe by phone today to assist with re-scheduling  with the RN Case Manager.  Follow up plan: Unsuccessful telephone outreach attempt made. A HIPAA compliant phone message was left for the patient providing contact information and requesting a return call.  Jeoffrey Buffalo , RMA     [facility]  [facility], [facility] Guide  Direct Dial: [phone]  Website: [email or web address]

## 2023-11-03 ENCOUNTER — Encounter (HOSPITAL_COMMUNITY): Payer: Self-pay

## 2023-11-03 ENCOUNTER — Other Ambulatory Visit (HOSPITAL_COMMUNITY): Payer: Self-pay

## 2023-11-05 ENCOUNTER — Other Ambulatory Visit (HOSPITAL_COMMUNITY): Payer: Self-pay

## 2023-11-06 ENCOUNTER — Other Ambulatory Visit (HOSPITAL_COMMUNITY): Payer: Self-pay

## 2023-11-06 MED ORDER — AMPHETAMINE-DEXTROAMPHETAMINE 10 MG PO TABS
ORAL_TABLET | ORAL | 0 refills | Status: DC
  Filled 2023-11-06: qty 90, 30d supply, fill #0
  Filled 2023-11-07 – 2023-11-09 (×2): qty 90, fill #0
  Filled 2023-11-13: qty 90, 30d supply, fill #0

## 2023-11-09 ENCOUNTER — Other Ambulatory Visit: Payer: Self-pay

## 2023-11-09 ENCOUNTER — Other Ambulatory Visit (HOSPITAL_COMMUNITY): Payer: Self-pay

## 2023-11-10 ENCOUNTER — Other Ambulatory Visit (HOSPITAL_COMMUNITY): Payer: Self-pay

## 2023-11-10 ENCOUNTER — Encounter (HOSPITAL_COMMUNITY): Payer: Self-pay

## 2023-11-10 ENCOUNTER — Other Ambulatory Visit: Payer: Self-pay | Admitting: Family Medicine

## 2023-11-11 ENCOUNTER — Other Ambulatory Visit (HOSPITAL_COMMUNITY): Payer: Self-pay

## 2023-11-12 ENCOUNTER — Encounter (HOSPITAL_COMMUNITY): Payer: Self-pay

## 2023-11-13 ENCOUNTER — Other Ambulatory Visit (HOSPITAL_COMMUNITY): Payer: Self-pay

## 2023-11-13 ENCOUNTER — Other Ambulatory Visit: Payer: Self-pay

## 2023-11-16 ENCOUNTER — Other Ambulatory Visit (HOSPITAL_COMMUNITY): Payer: Self-pay

## 2023-11-16 MED ORDER — GABAPENTIN 400 MG PO CAPS
800.0000 mg | ORAL_CAPSULE | Freq: Every evening | ORAL | 1 refills | Status: DC
  Filled 2023-11-21 – 2023-11-30 (×6): qty 60, 30d supply, fill #0

## 2023-11-16 MED ORDER — VENLAFAXINE HCL ER 150 MG PO CP24
300.0000 mg | ORAL_CAPSULE | Freq: Every day | ORAL | 1 refills | Status: DC
  Filled 2023-12-08 (×2): qty 60, 30d supply, fill #0

## 2023-11-16 MED ORDER — AMPHETAMINE-DEXTROAMPHETAMINE 10 MG PO TABS
10.0000 mg | ORAL_TABLET | Freq: Three times a day (TID) | ORAL | 0 refills | Status: DC
  Filled 2023-12-07 – 2023-12-11 (×3): qty 90, 30d supply, fill #0

## 2023-11-16 MED ORDER — AMPHETAMINE-DEXTROAMPHETAMINE 10 MG PO TABS
10.0000 mg | ORAL_TABLET | Freq: Three times a day (TID) | ORAL | 0 refills | Status: DC
  Filled 2024-01-08 (×2): qty 90, 30d supply, fill #0

## 2023-11-17 ENCOUNTER — Other Ambulatory Visit (HOSPITAL_COMMUNITY): Payer: Self-pay

## 2023-11-23 ENCOUNTER — Other Ambulatory Visit: Payer: Self-pay | Admitting: Family Medicine

## 2023-11-23 ENCOUNTER — Other Ambulatory Visit (HOSPITAL_COMMUNITY): Payer: Self-pay

## 2023-11-23 ENCOUNTER — Other Ambulatory Visit: Payer: Self-pay

## 2023-11-23 ENCOUNTER — Encounter (HOSPITAL_COMMUNITY): Payer: Self-pay

## 2023-11-23 MED ORDER — PANTOPRAZOLE SODIUM 40 MG PO TBEC
40.0000 mg | DELAYED_RELEASE_TABLET | Freq: Every day | ORAL | 1 refills | Status: DC
  Filled 2023-11-23: qty 30, 30d supply, fill #0

## 2023-11-26 ENCOUNTER — Other Ambulatory Visit: Payer: Self-pay

## 2023-11-26 ENCOUNTER — Other Ambulatory Visit (HOSPITAL_COMMUNITY): Payer: Self-pay

## 2023-11-27 ENCOUNTER — Other Ambulatory Visit: Payer: Self-pay

## 2023-11-27 ENCOUNTER — Other Ambulatory Visit (HOSPITAL_COMMUNITY): Payer: Self-pay

## 2023-11-30 ENCOUNTER — Other Ambulatory Visit: Payer: Self-pay | Admitting: Family Medicine

## 2023-11-30 ENCOUNTER — Other Ambulatory Visit (HOSPITAL_BASED_OUTPATIENT_CLINIC_OR_DEPARTMENT_OTHER): Payer: Self-pay

## 2023-12-01 ENCOUNTER — Other Ambulatory Visit: Payer: Self-pay

## 2023-12-01 ENCOUNTER — Other Ambulatory Visit (HOSPITAL_COMMUNITY): Payer: Self-pay

## 2023-12-01 MED ORDER — TAMSULOSIN HCL 0.4 MG PO CAPS
0.4000 mg | ORAL_CAPSULE | Freq: Every day | ORAL | 0 refills | Status: DC
  Filled 2023-12-01: qty 90, 90d supply, fill #0

## 2023-12-02 ENCOUNTER — Ambulatory Visit: Admitting: Internal Medicine

## 2023-12-07 ENCOUNTER — Other Ambulatory Visit (HOSPITAL_COMMUNITY): Payer: Self-pay

## 2023-12-07 ENCOUNTER — Encounter: Payer: Self-pay | Admitting: Family Medicine

## 2023-12-08 ENCOUNTER — Other Ambulatory Visit (HOSPITAL_COMMUNITY): Payer: Self-pay

## 2023-12-08 ENCOUNTER — Other Ambulatory Visit: Payer: Self-pay

## 2023-12-08 ENCOUNTER — Other Ambulatory Visit (HOSPITAL_BASED_OUTPATIENT_CLINIC_OR_DEPARTMENT_OTHER): Payer: Self-pay

## 2023-12-09 ENCOUNTER — Other Ambulatory Visit: Payer: Self-pay | Admitting: Family Medicine

## 2023-12-09 ENCOUNTER — Other Ambulatory Visit (HOSPITAL_BASED_OUTPATIENT_CLINIC_OR_DEPARTMENT_OTHER): Payer: Self-pay

## 2023-12-09 MED ORDER — OMEPRAZOLE 40 MG PO CPDR: 40.0000 mg | DELAYED_RELEASE_CAPSULE | Freq: Every day | ORAL | 3 refills | Status: DC

## 2023-12-09 MED ORDER — CLONAZEPAM 0.5 MG PO TABS: 0.5000 mg | ORAL_TABLET | Freq: Every day | ORAL | 0 refills | Status: DC | PRN

## 2023-12-09 MED ORDER — SPIRONOLACTONE 25 MG PO TABS: 25.0000 mg | ORAL_TABLET | Freq: Every day | ORAL | 3 refills | Status: DC

## 2023-12-09 MED ORDER — VENLAFAXINE HCL ER 150 MG PO CP24: 300.0000 mg | ORAL_CAPSULE | Freq: Every day | ORAL | 1 refills | Status: DC

## 2023-12-09 NOTE — Telephone Encounter (Signed)
 sent

## 2023-12-11 ENCOUNTER — Encounter (HOSPITAL_COMMUNITY): Payer: Self-pay

## 2023-12-11 ENCOUNTER — Other Ambulatory Visit (HOSPITAL_COMMUNITY): Payer: Self-pay

## 2023-12-11 ENCOUNTER — Other Ambulatory Visit: Payer: Self-pay

## 2023-12-11 ENCOUNTER — Other Ambulatory Visit (HOSPITAL_BASED_OUTPATIENT_CLINIC_OR_DEPARTMENT_OTHER): Payer: Self-pay

## 2023-12-25 ENCOUNTER — Encounter: Payer: Self-pay | Admitting: Family Medicine

## 2023-12-25 ENCOUNTER — Other Ambulatory Visit (HOSPITAL_COMMUNITY): Payer: Self-pay

## 2023-12-29 NOTE — Telephone Encounter (Signed)
 Mounjaro  is only covered by insurance if there is a diagnosis of Type 2 Diabetes. Since you do not have this diagnosis, your insurance will not cover the medication.

## 2023-12-30 ENCOUNTER — Other Ambulatory Visit: Payer: Self-pay | Admitting: Family Medicine

## 2023-12-30 ENCOUNTER — Other Ambulatory Visit (HOSPITAL_COMMUNITY): Payer: Self-pay

## 2023-12-31 ENCOUNTER — Other Ambulatory Visit (HOSPITAL_COMMUNITY): Payer: Self-pay

## 2023-12-31 ENCOUNTER — Encounter (HOSPITAL_COMMUNITY): Payer: Self-pay

## 2023-12-31 ENCOUNTER — Other Ambulatory Visit: Payer: Self-pay

## 2023-12-31 MED ORDER — TADALAFIL 20 MG PO TABS
20.0000 mg | ORAL_TABLET | ORAL | 1 refills | Status: DC | PRN
  Filled 2023-12-31: qty 10, 30d supply, fill #0
  Filled 2024-03-01: qty 10, 30d supply, fill #1

## 2024-01-01 ENCOUNTER — Other Ambulatory Visit (HOSPITAL_COMMUNITY): Payer: Self-pay

## 2024-01-04 ENCOUNTER — Encounter: Payer: Self-pay | Admitting: Family Medicine

## 2024-01-05 ENCOUNTER — Other Ambulatory Visit (HOSPITAL_COMMUNITY): Payer: Self-pay

## 2024-01-07 ENCOUNTER — Other Ambulatory Visit (HOSPITAL_COMMUNITY): Payer: Self-pay

## 2024-01-08 ENCOUNTER — Other Ambulatory Visit: Payer: Self-pay

## 2024-01-08 ENCOUNTER — Encounter (HOSPITAL_COMMUNITY): Payer: Self-pay

## 2024-01-08 ENCOUNTER — Other Ambulatory Visit (HOSPITAL_BASED_OUTPATIENT_CLINIC_OR_DEPARTMENT_OTHER): Payer: Self-pay

## 2024-01-08 ENCOUNTER — Other Ambulatory Visit (HOSPITAL_COMMUNITY): Payer: Self-pay

## 2024-01-19 ENCOUNTER — Other Ambulatory Visit (HOSPITAL_COMMUNITY): Payer: Self-pay

## 2024-01-20 ENCOUNTER — Other Ambulatory Visit (HOSPITAL_COMMUNITY): Payer: Self-pay

## 2024-01-20 ENCOUNTER — Other Ambulatory Visit: Payer: Self-pay | Admitting: Family Medicine

## 2024-01-21 ENCOUNTER — Other Ambulatory Visit (HOSPITAL_COMMUNITY): Payer: Self-pay

## 2024-01-22 ENCOUNTER — Encounter (HOSPITAL_COMMUNITY): Payer: Self-pay

## 2024-01-25 ENCOUNTER — Other Ambulatory Visit (HOSPITAL_COMMUNITY): Payer: Self-pay

## 2024-01-25 ENCOUNTER — Encounter (HOSPITAL_COMMUNITY): Payer: Self-pay

## 2024-01-26 ENCOUNTER — Other Ambulatory Visit (HOSPITAL_COMMUNITY): Payer: Self-pay

## 2024-01-27 ENCOUNTER — Other Ambulatory Visit (HOSPITAL_COMMUNITY): Payer: Self-pay

## 2024-01-28 ENCOUNTER — Other Ambulatory Visit (HOSPITAL_COMMUNITY): Payer: Self-pay

## 2024-01-28 ENCOUNTER — Encounter (HOSPITAL_COMMUNITY): Payer: Self-pay

## 2024-01-29 ENCOUNTER — Other Ambulatory Visit: Payer: Self-pay

## 2024-01-29 ENCOUNTER — Encounter: Payer: Self-pay | Admitting: Family Medicine

## 2024-01-29 ENCOUNTER — Other Ambulatory Visit (HOSPITAL_COMMUNITY): Payer: Self-pay

## 2024-02-01 ENCOUNTER — Encounter (HOSPITAL_COMMUNITY): Payer: Self-pay

## 2024-02-01 ENCOUNTER — Other Ambulatory Visit (HOSPITAL_COMMUNITY): Payer: Self-pay

## 2024-02-01 ENCOUNTER — Other Ambulatory Visit: Payer: Self-pay | Admitting: Family Medicine

## 2024-02-01 MED ORDER — VENLAFAXINE HCL ER 150 MG PO CP24
300.0000 mg | ORAL_CAPSULE | Freq: Every day | ORAL | 1 refills | Status: DC
  Filled 2024-02-01 – 2024-02-22 (×3): qty 60, 30d supply, fill #0

## 2024-02-02 ENCOUNTER — Other Ambulatory Visit: Payer: Self-pay | Admitting: Family Medicine

## 2024-02-02 ENCOUNTER — Encounter (HOSPITAL_COMMUNITY): Payer: Self-pay

## 2024-02-02 ENCOUNTER — Other Ambulatory Visit (HOSPITAL_COMMUNITY): Payer: Self-pay

## 2024-02-02 ENCOUNTER — Other Ambulatory Visit: Payer: Self-pay

## 2024-02-02 MED ORDER — SPIRONOLACTONE 25 MG PO TABS
25.0000 mg | ORAL_TABLET | Freq: Every day | ORAL | 3 refills | Status: DC
  Filled 2024-02-02: qty 30, 30d supply, fill #0
  Filled 2024-02-08 – 2024-02-29 (×2): qty 30, 30d supply, fill #1

## 2024-02-03 ENCOUNTER — Other Ambulatory Visit: Payer: Self-pay

## 2024-02-03 ENCOUNTER — Encounter (HOSPITAL_COMMUNITY): Payer: Self-pay

## 2024-02-03 ENCOUNTER — Other Ambulatory Visit (HOSPITAL_COMMUNITY): Payer: Self-pay

## 2024-02-03 MED ORDER — VENLAFAXINE HCL ER 150 MG PO CP24
300.0000 mg | ORAL_CAPSULE | Freq: Every day | ORAL | 1 refills | Status: DC
  Filled 2024-02-03 – 2024-02-22 (×6): qty 60, 30d supply, fill #0

## 2024-02-03 MED ORDER — AMPHETAMINE-DEXTROAMPHETAMINE 10 MG PO TABS
10.0000 mg | ORAL_TABLET | Freq: Three times a day (TID) | ORAL | 0 refills | Status: DC
  Filled 2024-03-03: qty 90, 30d supply, fill #0

## 2024-02-03 MED ORDER — AMPHETAMINE-DEXTROAMPHETAMINE 10 MG PO TABS
10.0000 mg | ORAL_TABLET | Freq: Three times a day (TID) | ORAL | 0 refills | Status: DC
  Filled 2024-02-03: qty 90, 30d supply, fill #0

## 2024-02-03 MED ORDER — GABAPENTIN 400 MG PO CAPS
800.0000 mg | ORAL_CAPSULE | Freq: Every evening | ORAL | 1 refills | Status: AC
  Filled 2024-02-03: qty 60, 30d supply, fill #0
  Filled 2024-02-08 – 2024-02-29 (×2): qty 60, 30d supply, fill #1

## 2024-02-05 ENCOUNTER — Other Ambulatory Visit: Payer: Self-pay | Admitting: Family Medicine

## 2024-02-05 ENCOUNTER — Encounter: Payer: Self-pay | Admitting: Family Medicine

## 2024-02-05 ENCOUNTER — Telehealth: Payer: Self-pay

## 2024-02-05 ENCOUNTER — Other Ambulatory Visit: Payer: Self-pay

## 2024-02-05 DIAGNOSIS — Z59819 Housing instability, housed unspecified: Secondary | ICD-10-CM

## 2024-02-05 DIAGNOSIS — F411 Generalized anxiety disorder: Secondary | ICD-10-CM

## 2024-02-05 NOTE — Telephone Encounter (Signed)
 Kindly place a referral to case management or a Child psychotherapist for assistance with housing.

## 2024-02-05 NOTE — Telephone Encounter (Signed)
 Referral placed.

## 2024-02-05 NOTE — Progress Notes (Signed)
 Complex Care Management Note  Care Guide Note 02/05/2024 Name: Julian Richardson MRN: [id number] DOB: [birth year]  Julian Richardson is a 53 y.o. year old male who sees Del Wilhelmena Falter, Hilario, FNP for primary care. I reached out to Sherwood Bathe by phone today to offer complex care management services.  Julian Richardson was given information about Complex Care Management services today including:   The Complex Care Management services include support from the care team which includes your Nurse Care Manager, Clinical Social Worker, or Pharmacist.  The Complex Care Management team is here to help remove barriers to the health concerns and goals most important to you. Complex Care Management services are voluntary, and the patient may decline or stop services at any time by request to their care team member.   Complex Care Management Consent Status: Patient agreed to services and verbal consent obtained.   Follow up plan:  Telephone appointment with complex care management team member scheduled for:  02/09/2024  Encounter Outcome:  Patient Scheduled  Julian Richardson , RMA     [place]  [facility], [facility] Guide  Direct Dial: [phone]  Website: [email or web address]

## 2024-02-08 ENCOUNTER — Other Ambulatory Visit: Payer: Self-pay

## 2024-02-08 ENCOUNTER — Other Ambulatory Visit (HOSPITAL_COMMUNITY): Payer: Self-pay

## 2024-02-09 ENCOUNTER — Other Ambulatory Visit: Payer: Self-pay | Admitting: Licensed Clinical Social Worker

## 2024-02-09 NOTE — Patient Outreach (Signed)
 Complex Care Management   Visit Note  02/09/2024  Name:  Julian Richardson MRN: [id number] DOB: [birth year]  Situation: Referral received for Complex Care Management related to SDOH Barriers:  Housing   Food insecurity Financial Resource Strain I obtained verbal consent from Patient.  Visit completed with Patient  on the phone  Background:   Past Medical History:  Diagnosis Date   ADHD (attention deficit hyperactivity disorder)    Anxiety    Gout    Hypertension    Sleep apnea     Assessment: Patient Reported Symptoms:  Cognitive Cognitive Status: Alert and oriented to person, place, and time, Normal speech and language skills, Insightful and able to interpret abstract concepts Cognitive/Intellectual Conditions Management [RPT]: None reported or documented in medical history or problem list   Health Maintenance Behaviors: Annual physical exam, Healthy diet, Stress management Healing Pattern: Average Health Facilitated by: Healthy diet, Stress management  Neurological Neurological Review of Symptoms: No symptoms reported    HEENT HEENT Symptoms Reported: No symptoms reported      Cardiovascular Cardiovascular Symptoms Reported: No symptoms reported Does patient have uncontrolled Hypertension?: Yes Is patient checking Blood Pressure at home?: Yes Patient's Recent BP reading at home: 157/80 taken about 4 days ago Cardiovascular Management Strategies: Medication therapy Cardiovascular Self-Management Outcome: 4 (good)  Respiratory Respiratory Symptoms Reported: Shortness of breath, Dry cough Other Respiratory Symptoms: Usually hapens at night Additional Respiratory Details: Patient thinks its because of the mold in his home Respiratory Management Strategies: Adequate rest, Breathing exercise Respiratory Self-Management Outcome: 4 (good)  Endocrine Endocrine Symptoms Reported: No symptoms reported Is patient diabetic?: No    Gastrointestinal Gastrointestinal Symptoms Reported: No  symptoms reported      Genitourinary Genitourinary Symptoms Reported: No symptoms reported    Integumentary Integumentary Symptoms Reported: No symptoms reported    Musculoskeletal Musculoskelatal Symptoms Reviewed: No symptoms reported        Psychosocial Psychosocial Symptoms Reported: No symptoms reported     Quality of Family Relationships: supportive Do you feel physically threatened by others?: No    02/09/2024    PHQ2-9 Depression Screening   Little interest or pleasure in doing things Not at all  Feeling down, depressed, or hopeless Not at all  PHQ-2 - Total Score 0  Trouble falling or staying asleep, or sleeping too much    Feeling tired or having little energy    Poor appetite or overeating     Feeling bad about yourself - or that you are a failure or have let yourself or your family down    Trouble concentrating on things, such as reading the newspaper or watching television    Moving or speaking so slowly that other people could have noticed.  Or the opposite - being so fidgety or restless that you have been moving around a lot more than usual    Thoughts that you would be better off dead, or hurting yourself in some way    PHQ2-9 Total Score    If you checked off any problems, how difficult have these problems made it for you to do your work, take care of things at home, or get along with other people    Depression Interventions/Treatment      There were no vitals filed for this visit.  Medications Reviewed Today     Reviewed by Veva Bolt, LCSW (Social Worker) on 02/09/24 at 1346  Med List Status: <None>   Medication Order Taking? Sig Documenting Provider Last Dose Status Informant  acetaminophen  (  TYLENOL ) 500 MG tablet [id number] Yes Take 1,000 mg by mouth every 6 (six) hours as needed for mild pain. [provider]  Active Self  allopurinol  (ZYLOPRIM ) 100 MG tablet [id number]  Take 1 tablet (100 mg total) by mouth daily. Del Orbe Polanco, Hilario,  FNP  Active   allopurinol  (ZYLOPRIM ) 100 MG tablet [id number]  Take 1 tablet (100 mg total) by mouth daily. Del Orbe Polanco, Hilario, FNP  Active   allopurinol  (ZYLOPRIM ) 300 MG tablet [id number] Yes Take 300 mg by mouth daily. [provider]  Active   amLODipine  (NORVASC ) 10 MG tablet [id number] Yes Take 10 mg by mouth daily. [provider]  Active   amphetamine -dextroamphetamine  (ADDERALL) 10 MG tablet [id number] Yes Take 10 mg by mouth in the morning, at noon, and at bedtime. [provider]  Active   amphetamine -dextroamphetamine  (ADDERALL) 10 MG tablet [id number]  Take 1 tablet by mouth three times a day (8AM + 12 PM+ 3 PM) for ADHD.   Active   amphetamine -dextroamphetamine  (ADDERALL) 10 MG tablet [id number]  Take 1 tablet (10 mg total) by mouth 3 (three) times daily for ADHD. (8am + 12pm + 3pm)   Active   amphetamine -dextroamphetamine  (ADDERALL) 10 MG tablet [id number]  Take 1 tablet  (10 mg) by mouth three times a day (8AM + 12 PM+ 3 PM) for ADHD.   Active   Benzocaine -Menthol -Zinc  Cl (ORAJEL 3X TOOTHACHE & GUM) 20-0.26-0.15 % GEL [id number] Yes Use as directed 1 Application in the mouth or throat daily as needed for mouth pain Del Wilhelmena Falter, [place], FNP  Active   BOOSTRIX 5-2.5-18.5 LF-MCG/0.5 injection [id number]    Patient not taking: Reported on 09/14/2023   [provider]  Active   busPIRone  (BUSPAR ) 15 MG tablet [id number] Yes Take 1 tablet (15 mg total) by mouth 2 (two) times daily. Del Wilhelmena Falter Hilario, FNP  Active     Discontinued 01/16/14 0133 Cholecalciferol (VITAMIN D -3 PO) [id number] Yes Take 1 tablet by mouth daily. [provider]  Active   clonazePAM  (KLONOPIN ) 0.5 MG tablet [id number] Yes Take 1 tablet (0.5 mg total) by mouth daily as needed for anxiety. Del Orbe Polanco, Hilario, FNP  Active   Coenzyme Q10 (COQ10 PO) [id number]  Take 1 tablet by mouth daily.  Patient not taking: Reported on 09/14/2023   [provider]  Active    ezetimibe  (ZETIA ) 10 MG tablet [id number]  Take 1 tablet (10 mg total) by mouth daily. Del Wilhelmena Falter, Hilario, FNP  Active   gabapentin  (NEURONTIN ) 400 MG capsule [id number]  Take 800 mg by mouth at bedtime. [provider]  Active   gabapentin  (NEURONTIN ) 400 MG capsule [id number]  Take 2 capsules (800 mg total) by mouth at bedtime as needed for anxiety/sleep   Active   gabapentin  (NEURONTIN ) 400 MG capsule [id number] Yes Take 2 capsules (800 mg total) by mouth at bedtime for anxiety/sleep.   Active   gabapentin  (NEURONTIN ) 400 MG capsule [id number]  Take 2 capsules (800 mg total) by mouth at bedtime for sleep/anxiety.   Active   gabapentin  (NEURONTIN ) 400 MG capsule [id number]  Take 2 capsules (800 mg total) by mouth at bedtime for anxiety/sleep/mood.   Active   Ginkgo Biloba 40 MG TABS [id number]  Take 1 tablet by mouth daily. [provider]  Active   hydrocortisone  1 % lotion [id number]  Apply 1 Application topically 2 (two) times daily.  Patient not taking: Reported on 09/14/2023   Terry Wilhelmena Falter Hilario, FNP  Active   hydrocortisone  1 % lotion [id number]  Apply 1 Application topically 2 (two) times daily. Del Wilhelmena Falter, [place], FNP  Active   methylPREDNISolone  acetate (DEPO-MEDROL ) injection 40 mg [id number]   Del Orbe Polanco, [place], FNP  Active   metoprolol  succinate (TOPROL -XL) 100 MG 24 hr tablet [id number] Yes Take 1 tablet (100 mg total) by mouth daily. Take with or immediately following a meal. Del Wilhelmena Falter, [place], FNP  Active   metoprolol  succinate (TOPROL -XL) 100 MG 24 hr tablet [id number]  Take 1 tablet (100 mg total) by mouth daily with or immediately following a meal. Del Wilhelmena Falter, Hilario, FNP  Active   nystatin  cream (MYCOSTATIN ) [id number]  Apply topically 2 (two) times daily.  Patient not taking: Reported on 09/14/2023   Del Orbe Polanco, [place], FNP  Active   nystatin  cream (MYCOSTATIN ) [id number]  Apply 1 Application topically 2 (two) times daily. Del  Wilhelmena Falter, [place], FNP  Active   Omega-3 Fatty Acids (OMEGA-3 FISH OIL PO) [id number]  Take 1 capsule by mouth daily.  Patient not taking: Reported on 09/14/2023   [provider]  Active   omeprazole  (PRILOSEC) 40 MG capsule [id number]  Take 1 capsule (40 mg total) by mouth daily. Del Orbe Polanco, Hilario, FNP  Active   rosuvastatin  (CRESTOR ) 40 MG tablet [id number]  Take 1 tablet (40 mg total) by mouth daily. Del Orbe Polanco, Hilario, FNP  Active   rosuvastatin  (CRESTOR ) 40 MG tablet [id number]  Take 1 tablet (40 mg total) by mouth daily. Del Orbe Polanco, Hilario, FNP  Active   spironolactone  (ALDACTONE ) 25 MG tablet [id number] Yes Take 1 tablet (25 mg total) by mouth daily. Del Orbe Polanco, Hilario, FNP  Active   tadalafil  (CIALIS ) 20 MG tablet [id number] Yes Take 1 tablet (20 mg total) by mouth as needed. Del Orbe Polanco, Hilario, FNP  Active   tamsulosin  (FLOMAX ) 0.4 MG CAPS capsule [id number] Yes Take 1 capsule (0.4 mg total) by mouth daily. Del Wilhelmena Falter, Hilario, FNP  Active   tamsulosin  (FLOMAX ) 0.4 MG CAPS capsule [id number]  Take 1 capsule (0.4 mg total) by mouth daily. Del Wilhelmena Falter, [place], FNP  Active   testosterone  cypionate (DEPOTESTOSTERONE CYPIONATE) 200 MG/ML injection [id number]  SMARTSIG:0.5 Milliliter(s) IM Once a Week  Patient not taking: Reported on 09/14/2023   [provider]  Active   tirzepatide  10 MG/0.5ML injection vial [id number]  Inject 10 mg into the skin once a week.  Patient not taking: Reported on 02/09/2024   Del Orbe Polanco, [place], FNP  Active   venlafaxine  XR (EFFEXOR  XR) 150 MG 24 hr capsule [id number] Yes Take 2 capsules (300 mg total) by mouth daily. Del Wilhelmena Falter, Hilario, FNP  Active   venlafaxine  XR (EFFEXOR  XR) 150 MG 24 hr capsule [id number]  Take 2 capsules (300 mg total) by mouth daily. Del Wilhelmena Falter, Hilario, FNP  Active   venlafaxine  XR (EFFEXOR  XR) 150 MG 24 hr capsule [id number]  Take 2 capsules (300 mg total) by mouth daily.    Active   Med List Note Lauralyn, Powell CROME, CPhT 10/28/13 1036): Patient states he has sleep apnea.             Recommendation:   PCP Follow-up  Follow Up Plan:   Telephone follow up appointment date/time:  02/23/24 at 1pm.  Cena Ligas, LCSW Clinical Social Worker [facility]

## 2024-02-09 NOTE — Patient Instructions (Signed)
 Visit Information  Thank you for taking time to visit with me today. Please don't hesitate to contact me if I can be of assistance to you before our next scheduled appointment.  Your next care management appointment is by telephone on 02/23/24 at 1pm  Referral to Care Guide.  Please call the care guide team at [phone] if you need to cancel, schedule, or reschedule an appointment.   Please call the Suicide and Crisis Lifeline: 988 call the [place]  National Suicide Prevention Lifeline: 6050019765 or TTY: 914-111-4330 TTY (506)246-5338) to talk to a trained counselor call 1-800-273-TALK (toll free, 24 hour hotline) call 911 if you are experiencing a Mental Health or Behavioral Health Crisis or need someone to talk to.  Julian Ligas, LCSW Clinical Social Worker [facility]

## 2024-02-10 ENCOUNTER — Other Ambulatory Visit (HOSPITAL_COMMUNITY): Payer: Self-pay

## 2024-02-10 ENCOUNTER — Encounter: Payer: Self-pay | Admitting: Family Medicine

## 2024-02-11 ENCOUNTER — Other Ambulatory Visit (HOSPITAL_COMMUNITY): Payer: Self-pay

## 2024-02-11 ENCOUNTER — Other Ambulatory Visit: Payer: Self-pay

## 2024-02-12 ENCOUNTER — Other Ambulatory Visit: Payer: Self-pay | Admitting: Family Medicine

## 2024-02-12 ENCOUNTER — Other Ambulatory Visit (HOSPITAL_COMMUNITY): Payer: Self-pay

## 2024-02-12 MED ORDER — TAMSULOSIN HCL 0.4 MG PO CAPS
0.4000 mg | ORAL_CAPSULE | Freq: Every day | ORAL | 0 refills | Status: DC
  Filled 2024-02-12: qty 90, 90d supply, fill #0

## 2024-02-15 ENCOUNTER — Other Ambulatory Visit: Payer: Self-pay | Admitting: Family Medicine

## 2024-02-16 ENCOUNTER — Other Ambulatory Visit (HOSPITAL_COMMUNITY): Payer: Self-pay

## 2024-02-18 ENCOUNTER — Other Ambulatory Visit: Payer: Self-pay | Admitting: Family Medicine

## 2024-02-21 ENCOUNTER — Other Ambulatory Visit: Payer: Self-pay | Admitting: Family Medicine

## 2024-02-22 ENCOUNTER — Encounter (HOSPITAL_COMMUNITY): Payer: Self-pay

## 2024-02-22 ENCOUNTER — Other Ambulatory Visit (HOSPITAL_COMMUNITY): Payer: Self-pay

## 2024-02-22 ENCOUNTER — Other Ambulatory Visit: Payer: Self-pay

## 2024-02-22 NOTE — Telephone Encounter (Signed)
 Called patient left voicemail to call or visit mychart to schedule an apontment

## 2024-02-23 ENCOUNTER — Other Ambulatory Visit: Payer: Self-pay | Admitting: Licensed Clinical Social Worker

## 2024-02-23 ENCOUNTER — Other Ambulatory Visit (HOSPITAL_COMMUNITY): Payer: Self-pay

## 2024-02-23 ENCOUNTER — Encounter (HOSPITAL_COMMUNITY): Payer: Self-pay

## 2024-02-23 DIAGNOSIS — F988 Other specified behavioral and emotional disorders with onset usually occurring in childhood and adolescence: Secondary | ICD-10-CM

## 2024-02-23 DIAGNOSIS — G4733 Obstructive sleep apnea (adult) (pediatric): Secondary | ICD-10-CM

## 2024-02-23 DIAGNOSIS — F411 Generalized anxiety disorder: Secondary | ICD-10-CM

## 2024-02-23 DIAGNOSIS — F32A Depression, unspecified: Secondary | ICD-10-CM

## 2024-02-23 DIAGNOSIS — I10 Essential (primary) hypertension: Secondary | ICD-10-CM

## 2024-02-23 DIAGNOSIS — M109 Gout, unspecified: Secondary | ICD-10-CM

## 2024-02-23 NOTE — Patient Outreach (Signed)
 Complex Care Management   Visit Note  02/24/2024  Name:  Julian Richardson MRN: [id number] DOB: 1970-06-25  Situation: Referral received for Complex Care Management related to SDOH Barriers:  Housing, [employer]. I obtained verbal consent from Patient.  Visit completed with Patient  on the phone. Patient lives with his mother. Patient reports that his housing is unsafe and he has requested updates from his landlord but no updates have been made. LCSW made referral to BSW for SDOH support.   Background:   Past Medical History:  Diagnosis Date   ADHD (attention deficit hyperactivity disorder)    Anxiety    Gout    Hypertension    Sleep apnea     Assessment: Patient Reported Symptoms:  Cognitive Cognitive Status: Normal speech and language skills, Alert and oriented to person, place, and time      Neurological Neurological Review of Symptoms: Not assessed    HEENT HEENT Symptoms Reported: Not assessed      Cardiovascular Cardiovascular Symptoms Reported: Not assessed    Respiratory Respiratory Symptoms Reported: Not assesed    Endocrine Endocrine Symptoms Reported: Not assessed    Gastrointestinal Gastrointestinal Symptoms Reported: Not assessed      Genitourinary Genitourinary Symptoms Reported: Not assessed    Integumentary Integumentary Symptoms Reported: Not assessed    Musculoskeletal Musculoskelatal Symptoms Reviewed: Not assessed        Psychosocial Psychosocial Symptoms Reported: Not assessed          02/24/2024    PHQ2-9 Depression Screening   Little interest or pleasure in doing things    Feeling down, depressed, or hopeless    PHQ-2 - Total Score    Trouble falling or staying asleep, or sleeping too much    Feeling tired or having little energy    Poor appetite or overeating     Feeling bad about yourself - or that you are a failure or have let yourself or your family down    Trouble concentrating on things, such as reading the newspaper  or watching television    Moving or speaking so slowly that other people could have noticed.  Or the opposite - being so fidgety or restless that you have been moving around a lot more than usual    Thoughts that you would be better off dead, or hurting yourself in some way    PHQ2-9 Total Score    If you checked off any problems, how difficult have these problems made it for you to do your work, take care of things at home, or get along with other people    Depression Interventions/Treatment      There were no vitals filed for this visit.  Medications Reviewed Today   Medications were not reviewed in this encounter     Recommendation:   PCP Follow-up Continue Current Plan of Care  Follow Up Plan:   Telephone follow up appointment date/time:  03/11/24 at 1pm.  Cena Ligas, LCSW Clinical Social Worker [facility]

## 2024-02-24 ENCOUNTER — Other Ambulatory Visit (HOSPITAL_COMMUNITY): Payer: Self-pay

## 2024-02-24 NOTE — Patient Instructions (Signed)
 Visit Information  Thank you for taking time to visit with me today. Please don't hesitate to contact me if I can be of assistance to you before our next scheduled appointment.  Our next appointment is by telephone on 03/11/24 at 1pm. Please call the care guide team at [phone] if you need to cancel or reschedule your appointment.   Following is a copy of your care plan:   Goals Addressed             This Visit's Progress    [facility] Social Work Care Plan LCSW       Problems:   Corporate Treasurer , Geophysicist/field Seismologist , and Housing   CSW Clinical Goal(s):   Over the next 90 days the Patient will explore community resource options for unmet needs related to [employer] , [employer] , and Housing .  Interventions:  Social Determinants of Health in Patient with : HTN, OSA, Gout, Depression and Anxiety SDOH assessments completed: Corporate Treasurer , Food Insecurity , and Housing  Evaluation of current treatment plan related to unmet needs Food resources: local food pantries  Housing resources: Applying for section 8 Discussed coping skills and relaxation techniques Discussed seeing a therapist- pt declined Discussed fridge not working and not being able to get in touch with landlord Discussed mold and unsafe apartment Discussed legal aid to learn about tenant rights Discussed openness to moving out of  to surrounding areas.  Patient reports he has food resources and is good in that area Discussed referral to BSW for SDOH support.  Patient Goals/Self-Care Activities:  Patient will complete section 8 applications to counties of interest. Continue taking your medication as prescribed.   Follow up with BSW for community resources for housing support and food pantries.  Increase coping skills, healthy habits, and stress reduction  Plan:   Telephone follow up appointment with care management team member scheduled for:  03/11/24 at 1pm.        Please call 911  if you are experiencing a Mental Health or Behavioral Health Crisis or need someone to talk to.  Patient verbalized understanding of Care plan and visit instructions communicated this visit  Cena Ligas, LCSW Clinical Social Worker [facility] [employer]

## 2024-02-25 ENCOUNTER — Other Ambulatory Visit (HOSPITAL_COMMUNITY): Payer: Self-pay

## 2024-02-29 ENCOUNTER — Other Ambulatory Visit (HOSPITAL_COMMUNITY): Payer: Self-pay

## 2024-02-29 ENCOUNTER — Other Ambulatory Visit: Payer: Self-pay

## 2024-02-29 ENCOUNTER — Other Ambulatory Visit: Payer: Self-pay | Admitting: Family Medicine

## 2024-02-29 MED ORDER — TAMSULOSIN HCL 0.4 MG PO CAPS
0.4000 mg | ORAL_CAPSULE | Freq: Every day | ORAL | 0 refills | Status: AC
  Filled 2024-02-29 – 2024-04-25 (×2): qty 90, 90d supply, fill #0

## 2024-02-29 MED ORDER — METOPROLOL SUCCINATE ER 100 MG PO TB24
100.0000 mg | ORAL_TABLET | ORAL | 3 refills | Status: AC
  Filled 2024-02-29: qty 90, 90d supply, fill #0
  Filled 2024-05-31: qty 90, 90d supply, fill #1

## 2024-03-01 ENCOUNTER — Other Ambulatory Visit (HOSPITAL_COMMUNITY): Payer: Self-pay

## 2024-03-01 ENCOUNTER — Other Ambulatory Visit: Payer: Self-pay

## 2024-03-01 ENCOUNTER — Ambulatory Visit: Admitting: Internal Medicine

## 2024-03-02 ENCOUNTER — Telehealth: Payer: Self-pay

## 2024-03-02 ENCOUNTER — Other Ambulatory Visit (HOSPITAL_COMMUNITY): Payer: Self-pay

## 2024-03-02 NOTE — Progress Notes (Signed)
 Complex Care Management Note  Care Guide Note 03/02/2024 Name: Indio Santilli MRN: [id number] DOB: 08-21-1970  Blanca Thornton is a 53 y.o. year old male who sees Del Wilhelmena Falter, Hilario, FNP for primary care. I reached out to Sherwood Bathe by phone today to offer complex care management services.  Mr. Belsito was given information about Complex Care Management services today including:   The Complex Care Management services include support from the care team which includes your Nurse Care Manager, Clinical Social Worker, or Pharmacist.  The Complex Care Management team is here to help remove barriers to the health concerns and goals most important to you. Complex Care Management services are voluntary, and the patient may decline or stop services at any time by request to their care team member.   Complex Care Management Consent Status: Patient agreed to services and verbal consent obtained.   Follow up plan:  Telephone appointment with complex care management team member scheduled for:  BSW 03/08/2024  Encounter Outcome:  Patient Scheduled  Jeoffrey Buffalo , RMA     [place]  [facility], [facility] Guide  Direct Dial: [phone]  Website: [email or web address]

## 2024-03-03 ENCOUNTER — Encounter (HOSPITAL_COMMUNITY): Payer: Self-pay

## 2024-03-03 ENCOUNTER — Other Ambulatory Visit (HOSPITAL_COMMUNITY): Payer: Self-pay

## 2024-03-03 ENCOUNTER — Other Ambulatory Visit: Payer: Self-pay

## 2024-03-04 ENCOUNTER — Other Ambulatory Visit (HOSPITAL_COMMUNITY): Payer: Self-pay

## 2024-03-04 ENCOUNTER — Ambulatory Visit: Admitting: Family Medicine

## 2024-03-08 ENCOUNTER — Ambulatory Visit: Admitting: Family Medicine

## 2024-03-08 ENCOUNTER — Other Ambulatory Visit: Payer: Self-pay

## 2024-03-08 NOTE — Patient Outreach (Signed)
 Social Drivers of Health  Community Resource and Care Coordination Visit Note   03/08/2024  Name: Julian Richardson MRN: [id number] DOB:19-Mar-1971  Situation: Referral received for [facility] needs assessment and assistance related to Housing . I obtained verbal consent from Patient.  Visit completed with Patient on the phone.   Background:   SDOH Interventions Today    Flowsheet Row Most Recent Value  SDOH Interventions   Housing Interventions --  [Not at risk of eviction, but need to relocate for safety and health concerns. Provided housing resources.]  Utilities Interventions Intervention Not Indicated  Financial Strain Interventions Intervention Not Indicated  [Can manage basic bills with $2400 monthly income and [employer] and Foodstamps and Medicaid]     Assessment:   Goals Addressed             This Visit's Progress    BSW Goals       Current SDOH Barriers:  Housing barriers  Interventions: Patient interviewed and appropriate screenings performed Referred patient to community resources  Advised patient to review housing information provided for [place] and [place]. SW provided contact information for [facility], [facility], [place].  Patient to apply for [employer]  Suggest patients elderly mother apply for housing.          Recommendation:   follow up with housing resources regarding housing needs Contact [facility] for more information.  Follow Up Plan:   Telephone follow up appointment date/time:  03/22/24 at 11am  Tillman Gardener, BSW [place]  [facility], [place] [facility] Social Worker Direct Dial: [phone]  Fax: [phone] Website: [email or web address]

## 2024-03-08 NOTE — Patient Instructions (Signed)
 Visit Information  Thank you for taking time to visit with me today. Please don't hesitate to contact me if I can be of assistance to you before our next scheduled appointment.  Our next appointment is by telephone on 03/22/24 at 11am Please call the care guide team at [phone] if you need to cancel or reschedule your appointment.   Following is a copy of your care plan:   Goals Addressed             This Visit's Progress    BSW Goals       Current SDOH Barriers:  Housing barriers  Interventions: Patient interviewed and appropriate screenings performed Referred patient to community resources  Advised patient to review housing information provided for [place] and [place]. SW provided contact information for [facility], [facility], [place].  Patient to apply for [employer]  Suggest patients elderly mother apply for housing.          Please call 911 if you are experiencing a Mental Health or Behavioral Health Crisis or need someone to talk to.  Patient verbalized understanding of Care plan and visit instructions communicated this visit  Tillman Gardener, BSW [place]  [facility], [facility] Social Worker Direct Dial: [phone]  Fax: [phone] Website: [email or web address]

## 2024-03-10 ENCOUNTER — Other Ambulatory Visit: Payer: Self-pay

## 2024-03-10 ENCOUNTER — Other Ambulatory Visit: Payer: Self-pay | Admitting: Family Medicine

## 2024-03-10 ENCOUNTER — Other Ambulatory Visit (HOSPITAL_COMMUNITY): Payer: Self-pay

## 2024-03-10 DIAGNOSIS — F411 Generalized anxiety disorder: Secondary | ICD-10-CM

## 2024-03-10 MED ORDER — CLONAZEPAM 0.5 MG PO TABS: 0.5000 mg | ORAL_TABLET | Freq: Every day | ORAL | 0 refills | Status: DC | PRN

## 2024-03-11 ENCOUNTER — Other Ambulatory Visit: Payer: Self-pay | Admitting: Licensed Clinical Social Worker

## 2024-03-11 ENCOUNTER — Other Ambulatory Visit: Payer: Self-pay

## 2024-03-11 NOTE — Patient Instructions (Signed)
 Visit Information  Thank you for taking time to visit with me today. Please don't hesitate to contact me if I can be of assistance to you before our next scheduled appointment.  Our next appointment is by telephone on 04/01/24 at 11am. Please call the care guide team at [phone] if you need to cancel or reschedule your appointment.   Following is a copy of your care plan:   Goals Addressed             This Visit's Progress    [facility] Social Work Care Plan LCSW       Problems:   Corporate Treasurer , Geophysicist/field Seismologist , and Housing   CSW Clinical Goal(s):   Over the next 90 days the Patient will explore community resource options for unmet needs related to [employer] , [employer] , and Housing .  Interventions:  Social Determinants of Health in Patient with : HTN, OSA, Gout, Depression and Anxiety SDOH assessments completed: Corporate Treasurer , Food Insecurity , and Housing  Evaluation of current treatment plan related to unmet needs Food resources: local food pantries  Housing resources: Applying for section 8 Discussed coping skills and relaxation techniques Discussed seeing a therapist- pt declined Discussed fridge not working and not being able to get in touch with landlord Discussed mold and unsafe apartment Discussed legal aid to learn about tenant rights Discussed openness to moving out of [place] to surrounding areas.  Patient reports he has food resources and is good in that area Discussed referral to BSW for SDOH support.  Patient Goals/Self-Care Activities:  Patient will complete section 8 applications to counties of interest. Continue taking your medication as prescribed.   Follow up with BSW for community resources for housing support and food pantries.  Increase coping skills, healthy habits, and stress reduction  Plan:   Telephone follow up appointment with care management team member scheduled for:  12/5//25 at 11pm.        Please call  911 if you are experiencing a Mental Health or Behavioral Health Crisis or need someone to talk to.  Patient verbalized understanding of Care plan and visit instructions communicated this visit  Cena Ligas, LCSW Clinical Social Worker [facility] [employer]

## 2024-03-11 NOTE — Patient Outreach (Signed)
 Complex Care Management   Visit Note  03/11/2024  Name:  Julian Richardson MRN: [id number] DOB: Oct 27, 1970  Situation: Referral received for Complex Care Management related to SDOH Barriers:  Housing I obtained verbal consent from Patient.  Visit completed with Patient  on the phone  Background:   Past Medical History:  Diagnosis Date   ADHD (attention deficit hyperactivity disorder)    Anxiety    Gout    Hypertension    Sleep apnea     Assessment: LCSW spoke to patient on the phone. Patient reports he has identified apartments that are within his budget but reports he cannot the security deposit, first and last months rent. Patient reports that his current apartment does not have heat, LCSW encouraged patient to reach out to legal aid.   Patient Reported Symptoms:  Cognitive Cognitive Status: Alert and oriented to person, place, and time, Normal speech and language skills Cognitive/Intellectual Conditions Management [RPT]: None reported or documented in medical history or problem list      Neurological Neurological Review of Symptoms: No symptoms reported    HEENT HEENT Symptoms Reported: No symptoms reported      Cardiovascular Cardiovascular Symptoms Reported: No symptoms reported    Respiratory Respiratory Symptoms Reported: No symptoms reported    Endocrine Endocrine Symptoms Reported: No symptoms reported    Gastrointestinal Gastrointestinal Symptoms Reported: No symptoms reported      Genitourinary Genitourinary Symptoms Reported: No symptoms reported    Integumentary Integumentary Symptoms Reported: No symptoms reported    Musculoskeletal Musculoskelatal Symptoms Reviewed: No symptoms reported        Psychosocial Psychosocial Symptoms Reported: No symptoms reported Behavioral Management Strategies: Coping strategies, Medication therapy Behavioral Health Self-Management Outcome: 3 (uncertain) Major Change/Loss/Stressor/Fears (CP): Denies Techniques to Cope with  Loss/Stress/Change: None      03/11/2024    PHQ2-9 Depression Screening   Little interest or pleasure in doing things    Feeling down, depressed, or hopeless    PHQ-2 - Total Score    Trouble falling or staying asleep, or sleeping too much    Feeling tired or having little energy    Poor appetite or overeating     Feeling bad about yourself - or that you are a failure or have let yourself or your family down    Trouble concentrating on things, such as reading the newspaper or watching television    Moving or speaking so slowly that other people could have noticed.  Or the opposite - being so fidgety or restless that you have been moving around a lot more than usual    Thoughts that you would be better off dead, or hurting yourself in some way    PHQ2-9 Total Score    If you checked off any problems, how difficult have these problems made it for you to do your work, take care of things at home, or get along with other people    Depression Interventions/Treatment      There were no vitals filed for this visit.    Medications Reviewed Today     Reviewed by Veva Bolt, LCSW (Social Worker) on 03/11/24 at 1314  Med List Status: <None>   Medication Order Taking? Sig Documenting Provider Last Dose Status Informant  allopurinol  (ZYLOPRIM ) 100 MG tablet [id number]  Take 1 tablet (100 mg total) by mouth daily. Del Orbe Polanco, Hilario, FNP  Active   amLODipine  (NORVASC ) 10 MG tablet [id number]  Take 10 mg by mouth daily. [provider]  Active  amphetamine -dextroamphetamine  (ADDERALL) 10 MG tablet [id number]  Take 1 tablet (10 mg total) by mouth 3 (three) times daily for ADHD. (8am + 12pm + 3pm)   Active   Discontinued 01/16/14 0133 clonazePAM  (KLONOPIN ) 0.5 MG tablet [id number]  Take 1 tablet (0.5 mg total) by mouth daily as needed for anxiety. Bevely Doffing, FNP  Active   gabapentin  (NEURONTIN ) 400 MG capsule [id number]  Take 2 capsules (800 mg total) by mouth at bedtime for  anxiety/sleep/mood.   Active   methylPREDNISolone  acetate (DEPO-MEDROL ) injection 40 mg [id number]   Del Orbe Polanco, Iliana, FNP  Active   metoprolol  succinate (TOPROL -XL) 100 MG 24 hr tablet [id number]  Take 1 tablet (100 mg total) by mouth daily with or immediately following a meal. Del Wilhelmena Falter, Hilario, FNP  Active   nystatin  cream (MYCOSTATIN ) [id number]  Apply 1 Application topically 2 (two) times daily. Del Wilhelmena Falter, Hilario, FNP  Active   omeprazole  (PRILOSEC) 40 MG capsule [id number]  Take 1 capsule (40 mg total) by mouth daily. Del Orbe Polanco, Hilario, FNP  Active   spironolactone  (ALDACTONE ) 25 MG tablet [id number]  Take 1 tablet (25 mg total) by mouth daily. Del Orbe Polanco, Hilario, FNP  Active   tadalafil  (CIALIS ) 20 MG tablet [id number]  Take 1 tablet (20 mg total) by mouth as needed. Del Wilhelmena Falter, Hilario, FNP  Active   tamsulosin  (FLOMAX ) 0.4 MG CAPS capsule [id number]  Take 1 capsule (0.4 mg total) by mouth daily. Del Wilhelmena Falter, Hilario, FNP  Active   tamsulosin  (FLOMAX ) 0.4 MG CAPS capsule [id number]  Take 1 capsule (0.4 mg total) by mouth daily. Del Wilhelmena Falter, Hilario, FNP  Active   venlafaxine  XR (EFFEXOR -XR) 150 MG 24 hr capsule [id number]  Take 2 capsules (300 mg total) by mouth daily. Del Wilhelmena Falter, Hilario, FNP  Active   Med List Note Lauralyn, Powell LITTIE Sola 10/28/13 1036): Patient states he has sleep apnea.             Recommendation:   PCP Follow-up Continue Current Plan of Care  Follow Up Plan:   Telephone follow up appointment date/time:  04/01/24 at 11am.  Cena Ligas, LCSW Clinical Social Worker [facility]

## 2024-03-14 ENCOUNTER — Encounter: Payer: Self-pay | Admitting: Urology

## 2024-03-14 ENCOUNTER — Ambulatory Visit: Payer: Self-pay | Admitting: Urology

## 2024-03-14 VITALS — BP 166/84 | HR 86

## 2024-03-14 DIAGNOSIS — N401 Enlarged prostate with lower urinary tract symptoms: Secondary | ICD-10-CM

## 2024-03-14 DIAGNOSIS — N5203 Combined arterial insufficiency and corporo-venous occlusive erectile dysfunction: Secondary | ICD-10-CM

## 2024-03-14 DIAGNOSIS — R7989 Other specified abnormal findings of blood chemistry: Secondary | ICD-10-CM | POA: Diagnosis not present

## 2024-03-14 DIAGNOSIS — R3912 Poor urinary stream: Secondary | ICD-10-CM | POA: Diagnosis not present

## 2024-03-14 DIAGNOSIS — N529 Male erectile dysfunction, unspecified: Secondary | ICD-10-CM

## 2024-03-14 DIAGNOSIS — N138 Other obstructive and reflux uropathy: Secondary | ICD-10-CM

## 2024-03-14 LAB — URINALYSIS, ROUTINE W REFLEX MICROSCOPIC
Bilirubin, UA: NEGATIVE
Glucose, UA: NEGATIVE
Ketones, UA: NEGATIVE
Leukocytes,UA: NEGATIVE
Nitrite, UA: NEGATIVE
RBC, UA: NEGATIVE
Specific Gravity, UA: 1.025 (ref 1.005–1.030)
Urobilinogen, Ur: 0.2 mg/dL (ref 0.2–1.0)
pH, UA: 6 (ref 5.0–7.5)

## 2024-03-14 LAB — MICROSCOPIC EXAMINATION
Bacteria, UA: NONE SEEN
RBC, Urine: NONE SEEN /HPF (ref 0–2)

## 2024-03-14 NOTE — Progress Notes (Unsigned)
 03/14/2024 9:26 AM   Julian Richardson Feb 12, 1971 [id number]  Referring provider: Terry Wilhelmena Lloyd Hilario, FNP [phone] [place],  [place] [address]  No chief complaint on file.   HPI:  New patient-  1) ED-patient with erectile dysfunction for 5 or more years.  Treated with PDE 5 inhibitor.  Took sildenafil 100 mg.  Tried daily tadalafil .  Evaluated by Dr. Carolee in 2022.  2) BPH-for greater than 5 years.  Had a weak stream, frequency and nocturia x 4.  Straining to void.  Takes tamsulosin  0.4 mg.  He has OSA and CPAP but does not always use it.  3) low testosterone -his 2022 testosterone  was 199, E38, PSA 0.15. May 2024 T 452, HCT 46. Oct 2024 PSA 0.2.Feb 2025 cr 1.1. He was started on T injections - 1 ml q 2 weeks at [facility].   Today, seen for the above. He would like to get back on testosterone . He has fatigue. ED. Wt gain. Takes PDE5i PRN. Doing well. He is on tamsulosin . If he misses a dose has weak stream.   IPSS 5. UA clear.   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   PMH: Past Medical History:  Diagnosis Date   ADHD (attention deficit hyperactivity disorder)    Anxiety    Gout    Hypertension    Sleep apnea     Surgical History: Past Surgical History:  Procedure Laterality Date   GASTRIC BYPASS     WISDOM TOOTH EXTRACTION Bilateral     Home Medications:  Allergies as of 03/14/2024       Reactions   Ibuprofen Other (See Comments)   Stomach upset. Stomach upset.   Morphine  And Codeine    Eyes burning   Nsaids Other (See Comments)   Stomach upset.   Tolmetin Other (See Comments)   Stomach upset.   Aspirin Nausea Only   Reglan  [metoclopramide ]    Akathisia type reaction        Medication List        Accurate as of March 14, 2024  9:26 AM. If you have any questions, ask your nurse or doctor.          allopurinol  100 MG tablet Commonly known as: ZYLOPRIM  Take 1 tablet (100 mg total) by mouth daily.   amLODipine  10 MG tablet Commonly  known as: NORVASC  Take 10 mg by mouth daily.   amphetamine -dextroamphetamine  10 MG tablet Commonly known as: Adderall Take 1 tablet (10 mg total) by mouth 3 (three) times daily for ADHD. (8am + 12pm + 3pm)   clonazePAM  0.5 MG tablet Commonly known as: KLONOPIN  Take 1 tablet (0.5 mg total) by mouth daily as needed for anxiety.   gabapentin  400 MG capsule Commonly known as: NEURONTIN  Take 2 capsules (800 mg total) by mouth at bedtime for anxiety/sleep/mood.   metoprolol  succinate 100 MG 24 hr tablet Commonly known as: TOPROL -XL Take 1 tablet (100 mg total) by mouth daily with or immediately following a meal.   nystatin  cream Commonly known as: MYCOSTATIN  Apply 1 Application topically 2 (two) times daily.   omeprazole  40 MG capsule Commonly known as: PRILOSEC Take 1 capsule (40 mg total) by mouth daily.   spironolactone  25 MG tablet Commonly known as: ALDACTONE  Take 1 tablet (25 mg total) by mouth daily.   tadalafil  20 MG tablet Commonly known as: CIALIS  Take 1 tablet (20 mg total) by mouth as needed.   tamsulosin  0.4 MG Caps capsule Commonly known as:  Flomax  Take 1 capsule (0.4 mg total) by mouth daily.   tamsulosin  0.4 MG Caps capsule Commonly known as: FLOMAX  Take 1 capsule (0.4 mg total) by mouth daily.   venlafaxine  XR 150 MG 24 hr capsule Commonly known as: EFFEXOR -XR Take 2 capsules (300 mg total) by mouth daily.        Allergies:  Allergies  Allergen Reactions   Ibuprofen Other (See Comments)    Stomach upset. Stomach upset.   Morphine  And Codeine     Eyes burning   Nsaids Other (See Comments)    Stomach upset.   Tolmetin Other (See Comments)    Stomach upset.   Aspirin Nausea Only   Reglan  [Metoclopramide ]     Akathisia type reaction     Family History: Family History  Problem Relation Age of Onset   Hypertension Mother    Diabetes Mother    Hypertension Father    Hyperlipidemia Father    Heart attack Father     Social History:   reports that he has never smoked. He has never been exposed to tobacco smoke. He has never used smokeless tobacco. He reports that he does not drink alcohol  and does not use drugs.   Physical Exam: BP (!) 166/84   Pulse 86   Constitutional:  Alert and oriented, No acute distress. HEENT: [place] AT, moist mucus membranes.  Trachea midline, no masses. Cardiovascular: No clubbing, cyanosis, or edema. Respiratory: Normal respiratory effort, no increased work of breathing. GI: Abdomen is soft, nontender, nondistended, no abdominal masses GU: No CVA tenderness Skin: No rashes, bruises or suspicious lesions. Neurologic: Grossly intact, no focal deficits, moving all 4 extremities. Psychiatric: Normal mood and affect.  Laboratory Data: Lab Results  Component Value Date   WBC 7.9 02/03/2023   HGB 14.3 02/03/2023   HCT 46.3 02/03/2023   MCV 84 02/03/2023   PLT 294 02/03/2023    Lab Results  Component Value Date   CREATININE 1.11 06/11/2023    No results found for: PSA  No results found for: TESTOSTERONE   Lab Results  Component Value Date   HGBA1C 5.7 (H) 06/11/2023    Urinalysis    Component Value Date/Time   COLORURINE YELLOW 08/04/2014 1155   APPEARANCEUR CLEAR 08/04/2014 1155   LABSPEC 1.017 08/04/2014 1155   PHURINE 7.0 08/04/2014 1155   GLUCOSEU NEGATIVE 08/04/2014 1155   HGBUR NEGATIVE 08/04/2014 1155   BILIRUBINUR NEGATIVE 08/04/2014 1155   KETONESUR NEGATIVE 08/04/2014 1155   PROTEINUR 100 (A) 08/04/2014 1155   UROBILINOGEN 1.0 08/04/2014 1155   NITRITE NEGATIVE 08/04/2014 1155   LEUKOCYTESUR NEGATIVE 08/04/2014 1155    Lab Results  Component Value Date   LABMICR 281.1 02/03/2023   BACTERIA FEW (A) 01/20/2014    Pertinent Imaging: N/a   Assessment & Plan:    Low T - labs sent. Restart If appropriate - discussed nature r/b/a to T including polycy and DVT among others. Discussed risk of MACE and PCa relation, etc. Also discussed importance of monitoring.  Discussed various delivery methods. If appropriate he will start injections and do them himself. Discussed lifelong nature. He doesn't want to have more kids / any kids.   ED - continue pde5i as needed  BPH - cont tamsulosin . Also discussed finsteride, procedures.   No follow-ups on file.  Donnice Brooks, MD  [facility]  [address] [place], [place] [address] [phone]

## 2024-03-15 ENCOUNTER — Ambulatory Visit

## 2024-03-15 ENCOUNTER — Encounter: Payer: Self-pay | Admitting: Urology

## 2024-03-15 ENCOUNTER — Ambulatory Visit: Payer: Self-pay

## 2024-03-15 ENCOUNTER — Telehealth: Payer: Self-pay | Admitting: Family Medicine

## 2024-03-15 ENCOUNTER — Other Ambulatory Visit: Payer: Self-pay

## 2024-03-15 VITALS — BP 150/90 | HR 91 | Ht 72.0 in | Wt 356.0 lb

## 2024-03-15 DIAGNOSIS — E559 Vitamin D deficiency, unspecified: Secondary | ICD-10-CM | POA: Diagnosis not present

## 2024-03-15 DIAGNOSIS — E782 Mixed hyperlipidemia: Secondary | ICD-10-CM | POA: Diagnosis not present

## 2024-03-15 DIAGNOSIS — R7303 Prediabetes: Secondary | ICD-10-CM

## 2024-03-15 DIAGNOSIS — G4733 Obstructive sleep apnea (adult) (pediatric): Secondary | ICD-10-CM

## 2024-03-15 DIAGNOSIS — I10 Essential (primary) hypertension: Secondary | ICD-10-CM

## 2024-03-15 DIAGNOSIS — Z6841 Body Mass Index (BMI) 40.0 and over, adult: Secondary | ICD-10-CM

## 2024-03-15 LAB — PROLACTIN: Prolactin: 4.7 ng/mL (ref 3.6–25.2)

## 2024-03-15 LAB — HEMOGLOBIN AND HEMATOCRIT, BLOOD
Hematocrit: 41.4 % (ref 37.5–51.0)
Hemoglobin: 13.3 g/dL (ref 13.0–17.7)

## 2024-03-15 LAB — PSA: Prostate Specific Ag, Serum: 0.2 ng/mL (ref 0.0–4.0)

## 2024-03-15 LAB — TESTOSTERONE: Testosterone: 268 ng/dL (ref 264–916)

## 2024-03-15 MED ORDER — ZEPBOUND 2.5 MG/0.5ML ~~LOC~~ SOAJ
2.5000 mg | SUBCUTANEOUS | 2 refills | Status: AC
Start: 2024-03-15 — End: ?

## 2024-03-15 MED ORDER — TIRZEPATIDE-WEIGHT MANAGEMENT 2.5 MG/0.5ML ~~LOC~~ SOLN: 2.5000 mg | SUBCUTANEOUS | 1 refills | Status: DC

## 2024-03-15 MED ORDER — SPIRONOLACTONE 25 MG PO TABS: 25.0000 mg | ORAL_TABLET | Freq: Two times a day (BID) | ORAL | 3 refills | Status: AC

## 2024-03-15 NOTE — Telephone Encounter (Signed)
 Please advise on what Rx pt is referring too and send in if needed

## 2024-03-15 NOTE — Progress Notes (Unsigned)
 Established Patient Office Visit  Subjective   Patient ID: Julian Richardson, male    DOB: 12/25/1970  Age: 53 y.o. MRN: [id number]  Chief Complaint  Patient presents with   Medical Management of Chronic Issues    Pt is experienceing excessive tiredness and thinks its due to the higher dose of zepbound  wants to talk about lowering the does     HPI Discussed the use of AI scribe software for clinical note transcription with the patient, who gave verbal consent to proceed.  History of Present Illness    Julian Richardson is a 53 year old male with hypertension who presents with fatigue and concerns about medication efficacy.  Fatigue and environmental exposure - Significant fatigue attributed to current living environment. - Exposure to second-hand vapors and freebasing activities in apartment complex. - Believes environmental factors are causing headaches and interfering with absorption of ADD medication. - Mother, who lives with him, is experiencing similar symptoms.  Hypertension management - Hypertension managed with amlodipine , metoprolol , and spironolactone . - Recent elevated blood pressure during urology visit. - Blood pressure improves when away from current living environment.  Sleep disturbances and weight management - Previously on Mounjaro , resulting in nearly 35-pound weight loss. - Switched to Zepbound  due to insurance issues; found beneficial for sleep apnea and overall rest. - Has not taken Zepbound  for over six weeks, resulting in return of symptoms including weight gain and sleep disturbances.  Attention deficit disorder medication absorption - ADD medication absorption believed to be affected by environmental exposures. - Difficulty focusing on online master's degree studies due to living conditions.     Patient Active Problem List   Diagnosis Date Noted   Tooth pain 04/17/2023   Acute gout of left knee 02/03/2023   Anxiety 02/03/2023   Morbid obesity (HCC)  08/29/2021   Hypertension 08/29/2021   Prediabetes 08/29/2021   Mixed hyperlipidemia 08/29/2021   Vitamin D  deficiency 08/29/2021   ADD (attention deficit disorder) without hyperactivity 10/17/2014   Depression 10/17/2014   GAD (generalized anxiety disorder) 10/17/2014   Gout, unspecified 10/11/2014   OSA (obstructive sleep apnea) 10/11/2014   Gastric bypass status for obesity 09/19/2014    ROS    Objective:     BP (!) 150/90 (BP Location: Left Arm, Patient Position: Sitting, Cuff Size: Large)   Pulse 91   Ht 6' (1.829 m)   Wt (!) 356 lb (161.5 kg)   SpO2 96%   BMI 48.28 kg/m  BP Readings from Last 3 Encounters:  03/15/24 (!) 150/90  03/14/24 (!) 166/84  06/11/23 (!) 164/94   Wt Readings from Last 3 Encounters:  03/15/24 (!) 356 lb (161.5 kg)  09/14/23 (!) 348 lb (157.9 kg)  06/11/23 (!) 357 lb 3.2 oz (162 kg)      Physical Exam Vitals and nursing note reviewed.  Constitutional:      Appearance: Normal appearance.  HENT:     Head: Normocephalic.     Right Ear: Tympanic membrane, ear canal and external ear normal.     Left Ear: Tympanic membrane, ear canal and external ear normal.     Nose: Nose normal.     Mouth/Throat:     Mouth: Mucous membranes are moist.     Pharynx: Oropharynx is clear.  Eyes:     Extraocular Movements: Extraocular movements intact.     Pupils: Pupils are equal, round, and reactive to light.  Cardiovascular:     Rate and Rhythm: Normal rate and regular rhythm.  Pulmonary:  Effort: Pulmonary effort is normal.     Breath sounds: Normal breath sounds.  Musculoskeletal:     Cervical back: Normal range of motion and neck supple.  Skin:    General: Skin is warm and dry.  Neurological:     Mental Status: He is alert and oriented to person, place, and time.  Psychiatric:        Mood and Affect: Mood normal.        Thought Content: Thought content normal.      Results for orders placed or performed in visit on 03/15/24  HgB A1c   Result Value Ref Range   Hgb A1c MFr Bld 5.8 (H) 4.8 - 5.6 %   Est. average glucose Bld gHb Est-mCnc 120 mg/dL  Lipid Profile  Result Value Ref Range   Cholesterol, Total 164 100 - 199 mg/dL   Triglycerides 821 (H) 0 - 149 mg/dL   HDL 41 >60 mg/dL   VLDL Cholesterol Cal 31 5 - 40 mg/dL   LDL Chol Calc (NIH) 92 0 - 99 mg/dL   Chol/HDL Ratio 4.0 0.0 - 5.0 ratio  TSH + free T4  Result Value Ref Range   TSH 0.531 0.450 - 4.500 uIU/mL   Free T4 1.08 0.82 - 1.77 ng/dL  RFE85+ZHQM  Result Value Ref Range   Glucose 83 70 - 99 mg/dL   BUN 12 6 - 24 mg/dL   Creatinine, Ser 8.84 0.76 - 1.27 mg/dL   eGFR 77 >40 fO/fpw/8.26   BUN/Creatinine Ratio 10 9 - 20   Sodium 143 134 - 144 mmol/L   Potassium 4.0 3.5 - 5.2 mmol/L   Chloride 107 (H) 96 - 106 mmol/L   CO2 21 20 - 29 mmol/L   Calcium  8.9 8.7 - 10.2 mg/dL   Total Protein 6.3 6.0 - 8.5 g/dL   Albumin 3.8 3.8 - 4.9 g/dL   Globulin, Total 2.5 1.5 - 4.5 g/dL   Bilirubin Total 0.2 0.0 - 1.2 mg/dL   Alkaline Phosphatase 71 47 - 123 IU/L   AST 22 0 - 40 IU/L   ALT 23 0 - 44 IU/L  Vitamin D  (25 hydroxy)  Result Value Ref Range   Vit D, 25-Hydroxy 30.8 30.0 - 100.0 ng/mL    Last CBC Lab Results  Component Value Date   WBC 7.9 02/03/2023   HGB 13.3 03/14/2024   HCT 41.4 03/14/2024   MCV 84 02/03/2023   MCH 26.0 (L) 02/03/2023   RDW 19.3 (H) 02/03/2023   PLT 294 02/03/2023   Last metabolic panel Lab Results  Component Value Date   GLUCOSE 83 03/15/2024   NA 143 03/15/2024   K 4.0 03/15/2024   CL 107 (H) 03/15/2024   CO2 21 03/15/2024   BUN 12 03/15/2024   CREATININE 1.15 03/15/2024   EGFR 77 03/15/2024   CALCIUM  8.9 03/15/2024   PROT 6.3 03/15/2024   ALBUMIN 3.8 03/15/2024   LABGLOB 2.5 03/15/2024   BILITOT 0.2 03/15/2024   ALKPHOS 71 03/15/2024   AST 22 03/15/2024   ALT 23 03/15/2024   ANIONGAP 9 03/28/2021   Last lipids Lab Results  Component Value Date   CHOL 164 03/15/2024   HDL 41 03/15/2024   LDLCALC 92  03/15/2024   TRIG 178 (H) 03/15/2024   CHOLHDL 4.0 03/15/2024   Last hemoglobin A1c Lab Results  Component Value Date   HGBA1C 5.8 (H) 03/15/2024   Last thyroid  functions Lab Results  Component Value Date   TSH 0.531  03/15/2024   FREET4 1.08 03/15/2024   Last vitamin D  Lab Results  Component Value Date   VD25OH 30.8 03/15/2024   Last vitamin B12 and Folate No results found for: VITAMINB12, FOLATE    The 10-year ASCVD risk score (Arnett DK, et al., 2019) is: 23%    Assessment & Plan:   Problem List Items Addressed This Visit       Cardiovascular and Mediastinum   Hypertension - Primary   Hypertension poorly controlled with current regimen. Environmental stressors and secondhand smoke exposure may contribute. - Increased spironolactone  to twice daily. - Continue amlodipine  and metoprolol  as prescribed.      Relevant Medications   spironolactone  (ALDACTONE ) 25 MG tablet   Other Relevant Orders   CMP14+EGFR (Completed)     Respiratory   OSA (obstructive sleep apnea)   Sleep apnea symptoms improved with Zepbound . - Continue Zepbound  for management of sleep apnea.        Other   Morbid obesity (HCC)   Obesity management complicated by insurance issues. Zepbound  effective for weight management and sleep apnea. - Continue Zepbound  as covered by insurance.       Prediabetes   Recheck A1c level today.      Relevant Orders   HgB A1c (Completed)   CMP14+EGFR (Completed)   Mixed hyperlipidemia   Recheck fasting labs.        Relevant Medications   spironolactone  (ALDACTONE ) 25 MG tablet   Other Relevant Orders   Lipid Profile (Completed)   TSH + free T4 (Completed)   CMP14+EGFR (Completed)   Vitamin D  deficiency   Recheck vitamin D  level      Relevant Orders   Vitamin D  (25 hydroxy) (Completed)    No follow-ups on file.    Leita Longs, FNP

## 2024-03-15 NOTE — Telephone Encounter (Signed)
 Zepbound  changed to pen

## 2024-03-15 NOTE — Telephone Encounter (Signed)
Please review and send in Rx

## 2024-03-15 NOTE — Telephone Encounter (Signed)
 Copied from CRM #[id number]. Topic: Clinical - Prescription Issue >> Mar 15, 2024  9:57 AM Wess RAMAN wrote: Reason for CRM: Pharmacy stated zepbound  2.5 was sent in for the vial but they do not carry and need approval to dispense the pen  Pharmacy: [facility] - [place], [place] - [address] [place] [address] Phone: [phone] Fax: [phone] Hours: Not open 24 hours

## 2024-03-16 ENCOUNTER — Encounter (HOSPITAL_COMMUNITY): Payer: Self-pay

## 2024-03-16 LAB — CMP14+EGFR
ALT: 23 IU/L (ref 0–44)
AST: 22 IU/L (ref 0–40)
Albumin: 3.8 g/dL (ref 3.8–4.9)
Alkaline Phosphatase: 71 IU/L (ref 47–123)
BUN/Creatinine Ratio: 10 (ref 9–20)
BUN: 12 mg/dL (ref 6–24)
Bilirubin Total: 0.2 mg/dL (ref 0.0–1.2)
CO2: 21 mmol/L (ref 20–29)
Calcium: 8.9 mg/dL (ref 8.7–10.2)
Chloride: 107 mmol/L — ABNORMAL HIGH (ref 96–106)
Creatinine, Ser: 1.15 mg/dL (ref 0.76–1.27)
Globulin, Total: 2.5 g/dL (ref 1.5–4.5)
Glucose: 83 mg/dL (ref 70–99)
Potassium: 4 mmol/L (ref 3.5–5.2)
Sodium: 143 mmol/L (ref 134–144)
Total Protein: 6.3 g/dL (ref 6.0–8.5)
eGFR: 77 mL/min/1.73 (ref >59)

## 2024-03-16 LAB — VITAMIN D 25 HYDROXY (VIT D DEFICIENCY, FRACTURES): Vit D, 25-Hydroxy: 30.8 ng/mL (ref 30.0–100.0)

## 2024-03-16 LAB — LIPID PANEL
Chol/HDL Ratio: 4 ratio (ref 0.0–5.0)
Cholesterol, Total: 164 mg/dL (ref 100–199)
HDL: 41 mg/dL (ref >39)
LDL Chol Calc (NIH): 92 mg/dL (ref 0–99)
Triglycerides: 178 mg/dL — ABNORMAL HIGH (ref 0–149)
VLDL Cholesterol Cal: 31 mg/dL (ref 5–40)

## 2024-03-16 LAB — TSH+FREE T4
Free T4: 1.08 ng/dL (ref 0.82–1.77)
TSH: 0.531 u[IU]/mL (ref 0.450–4.500)

## 2024-03-16 LAB — HEMOGLOBIN A1C
Est. average glucose Bld gHb Est-mCnc: 120 mg/dL
Hgb A1c MFr Bld: 5.8 % — ABNORMAL HIGH (ref 4.8–5.6)

## 2024-03-16 NOTE — Telephone Encounter (Signed)
 Please fill

## 2024-03-16 NOTE — Telephone Encounter (Signed)
 FYI

## 2024-03-17 ENCOUNTER — Other Ambulatory Visit (HOSPITAL_COMMUNITY): Payer: Self-pay

## 2024-03-17 ENCOUNTER — Ambulatory Visit: Payer: Self-pay

## 2024-03-17 MED ORDER — TESTOSTERONE CYPIONATE 200 MG/ML IJ SOLN: 0.5000 mL | INTRAMUSCULAR | 5 refills | Status: AC

## 2024-03-17 NOTE — Assessment & Plan Note (Signed)
 Obesity management complicated by insurance issues. Zepbound  effective for weight management and sleep apnea. - Continue Zepbound  as covered by insurance.

## 2024-03-17 NOTE — Assessment & Plan Note (Signed)
Recheck fasting labs.  

## 2024-03-17 NOTE — Addendum Note (Signed)
 Addended by: NIEVES COUGH R on: 03/17/2024 03:58 PM   Modules accepted: Orders

## 2024-03-17 NOTE — Assessment & Plan Note (Signed)
Recheck A1c level today

## 2024-03-17 NOTE — Assessment & Plan Note (Signed)
 Hypertension poorly controlled with current regimen. Environmental stressors and secondhand smoke exposure may contribute. - Increased spironolactone  to twice daily. - Continue amlodipine  and metoprolol  as prescribed.

## 2024-03-17 NOTE — Assessment & Plan Note (Signed)
 Recheck vitamin D  level

## 2024-03-17 NOTE — Assessment & Plan Note (Signed)
 Sleep apnea symptoms improved with Zepbound . - Continue Zepbound  for management of sleep apnea.

## 2024-03-22 ENCOUNTER — Encounter (HOSPITAL_COMMUNITY): Payer: Self-pay

## 2024-03-22 ENCOUNTER — Other Ambulatory Visit: Payer: Self-pay

## 2024-03-22 ENCOUNTER — Other Ambulatory Visit (HOSPITAL_COMMUNITY): Payer: Self-pay

## 2024-03-22 ENCOUNTER — Other Ambulatory Visit: Payer: Self-pay | Admitting: Family Medicine

## 2024-03-22 MED ORDER — ALLOPURINOL 100 MG PO TABS
100.0000 mg | ORAL_TABLET | Freq: Every day | ORAL | 1 refills | Status: DC
  Filled 2024-03-22: qty 90, 90d supply, fill #0

## 2024-03-22 NOTE — Patient Outreach (Signed)
 Social Drivers of Health  Community Resource and Care Coordination Visit Note   03/22/2024  Name: Julian Richardson MRN: [id number] DOB:12-May-1970  Situation: Referral received for [facility] needs assessment and assistance related to Housing . I obtained verbal consent from Patient.  Visit completed with Patient on the phone.   Background:   SDOH Interventions Today    Flowsheet Row Most Recent Value  SDOH Interventions   Housing Interventions Other (Comment)  [Patient is waiting for return call from [facility]. Pt has reached out to several housing options but nothing approved. Pt is working with his mother to apply for Public Housing,]     Assessment:   Goals Addressed             This Visit's Progress    COMPLETED: BSW Goals       Current SDOH Barriers:  Housing barriers  Interventions: Patient interviewed and appropriate screenings performed Referred patient to community resources  Advised patient to review housing information provided for [employer]. SW provided contact information for CAP, [facility], [place].  Patient to apply for [employer]  Suggest patients elderly mother apply for housing. Patient has started application for housing for his mother but has not finished. Patient will wait to consider CAP for mother.          Recommendation:   follow up with community resources regarding housing needs  Follow Up Plan:   Patient has achieved all patient stated goals. [employer] will be closed. Patient has been provided contact information should new needs arise.   Tillman Gardener, BSW [place]  [facility] [place] [facility] Social Worker Direct Dial: [phone]  Fax: [phone] Website: [email or web address]

## 2024-03-22 NOTE — Patient Instructions (Signed)

## 2024-03-28 ENCOUNTER — Ambulatory Visit: Attending: Cardiology | Admitting: Cardiology

## 2024-03-28 NOTE — Progress Notes (Deleted)
 Clinical Summary Mr. Woolum is a 53 y.o.male last seen by Dr Okey in 10/2015, seen today as a new patient for the following medical problems.  1.HTN -     2.OSA   3. HLD  Past Medical History:  Diagnosis Date   ADHD (attention deficit hyperactivity disorder)    Anxiety    Gout    Hypertension    Sleep apnea      Allergies  Allergen Reactions   Ibuprofen Other (See Comments)    Stomach upset. Stomach upset.   Morphine  And Codeine     Eyes burning   Nsaids Other (See Comments)    Stomach upset.   Tolmetin Other (See Comments)    Stomach upset.   Aspirin Nausea Only   Reglan  [Metoclopramide ]     Akathisia type reaction      Current Outpatient Medications  Medication Sig Dispense Refill   allopurinol  (ZYLOPRIM ) 100 MG tablet Take 1 tablet (100 mg total) by mouth daily. 90 tablet 1   amLODipine  (NORVASC ) 10 MG tablet Take 10 mg by mouth daily.     amphetamine -dextroamphetamine  (ADDERALL) 10 MG tablet Take 1 tablet (10 mg total) by mouth 3 (three) times daily for ADHD. (8am + 12pm + 3pm) 90 tablet 0   clonazePAM  (KLONOPIN ) 0.5 MG tablet Take 1 tablet (0.5 mg total) by mouth daily as needed for anxiety. 20 tablet 0   gabapentin  (NEURONTIN ) 400 MG capsule Take 2 capsules (800 mg total) by mouth at bedtime for anxiety/sleep/mood. 60 capsule 1   metoprolol  succinate (TOPROL -XL) 100 MG 24 hr tablet Take 1 tablet (100 mg total) by mouth daily with or immediately following a meal. 90 tablet 3   nystatin  cream (MYCOSTATIN ) Apply 1 Application topically 2 (two) times daily. 30 g 1   omeprazole  (PRILOSEC) 40 MG capsule Take 1 capsule (40 mg total) by mouth daily. 30 capsule 3   spironolactone  (ALDACTONE ) 25 MG tablet Take 1 tablet (25 mg total) by mouth 2 (two) times daily. 60 tablet 3   tadalafil  (CIALIS ) 20 MG tablet Take 1 tablet (20 mg total) by mouth as needed. 10 tablet 1   tamsulosin  (FLOMAX ) 0.4 MG CAPS capsule Take 1 capsule (0.4 mg total) by mouth daily. 30  capsule 2   tamsulosin  (FLOMAX ) 0.4 MG CAPS capsule Take 1 capsule (0.4 mg total) by mouth daily. 90 capsule 0   Testosterone  Cypionate 200 MG/ML SOLN Inject 0.5 mLs into the muscle every 7 (seven) days. 10 mL 5   tirzepatide  (ZEPBOUND ) 2.5 MG/0.5ML Pen Inject 2.5 mg into the skin once a week. 2 mL 2   venlafaxine  XR (EFFEXOR -XR) 150 MG 24 hr capsule Take 2 capsules (300 mg total) by mouth daily. 60 capsule 1   Current Facility-Administered Medications  Medication Dose Route Frequency Provider Last Rate Last Admin   methylPREDNISolone  acetate (DEPO-MEDROL ) injection 40 mg  40 mg Intramuscular Once          Past Surgical History:  Procedure Laterality Date   GASTRIC BYPASS     WISDOM TOOTH EXTRACTION Bilateral      Allergies  Allergen Reactions   Ibuprofen Other (See Comments)    Stomach upset. Stomach upset.   Morphine  And Codeine     Eyes burning   Nsaids Other (See Comments)    Stomach upset.   Tolmetin Other (See Comments)    Stomach upset.   Aspirin Nausea Only   Reglan  [Metoclopramide ]     Akathisia type reaction  Family History  Problem Relation Age of Onset   Hypertension Mother    Diabetes Mother    Hypertension Father    Hyperlipidemia Father    Heart attack Father      Social History Mr. Parcell reports that he has never smoked. He has never been exposed to tobacco smoke. He has never used smokeless tobacco. Mr. Saab reports no history of alcohol  use.   Review of Systems CONSTITUTIONAL: No weight loss, fever, chills, weakness or fatigue.  HEENT: Eyes: No visual loss, blurred vision, double vision or yellow sclerae.No hearing loss, sneezing, congestion, runny nose or sore throat.  SKIN: No rash or itching.  CARDIOVASCULAR:  RESPIRATORY: No shortness of breath, cough or sputum.  GASTROINTESTINAL: No anorexia, nausea, vomiting or diarrhea. No abdominal pain or blood.  GENITOURINARY: No burning on urination, no polyuria NEUROLOGICAL: No headache,  dizziness, syncope, paralysis, ataxia, numbness or tingling in the extremities. No change in bowel or bladder control.  MUSCULOSKELETAL: No muscle, back pain, joint pain or stiffness.  LYMPHATICS: No enlarged nodes. No history of splenectomy.  PSYCHIATRIC: No history of depression or anxiety.  ENDOCRINOLOGIC: No reports of sweating, cold or heat intolerance. No polyuria or polydipsia.  SABRA   Physical Examination There were no vitals filed for this visit. There were no vitals filed for this visit.  Gen: resting comfortably, no acute distress HEENT: no scleral icterus, pupils equal round and reactive, no palptable cervical adenopathy,  CV Resp: Clear to auscultation bilaterally GI: abdomen is soft, non-tender, non-distended, normal bowel sounds, no hepatosplenomegaly MSK: extremities are warm, no edema.  Skin: warm, no rash Neuro:  no focal deficits Psych: appropriate affect   Diagnostic Studies     Assessment and Plan        Dorn PHEBE Ross, M.D., F.A.C.C.

## 2024-03-29 ENCOUNTER — Other Ambulatory Visit: Payer: Self-pay | Admitting: Family Medicine

## 2024-03-29 ENCOUNTER — Other Ambulatory Visit: Payer: Self-pay

## 2024-03-29 ENCOUNTER — Other Ambulatory Visit (HOSPITAL_COMMUNITY): Payer: Self-pay

## 2024-03-29 MED ORDER — TADALAFIL 20 MG PO TABS
20.0000 mg | ORAL_TABLET | ORAL | 1 refills | Status: AC | PRN
  Filled 2024-03-29: qty 10, 30d supply, fill #0

## 2024-03-30 ENCOUNTER — Other Ambulatory Visit (HOSPITAL_BASED_OUTPATIENT_CLINIC_OR_DEPARTMENT_OTHER): Payer: Self-pay

## 2024-03-30 ENCOUNTER — Other Ambulatory Visit: Payer: Self-pay | Admitting: Family Medicine

## 2024-03-30 ENCOUNTER — Other Ambulatory Visit (HOSPITAL_COMMUNITY): Payer: Self-pay

## 2024-03-30 ENCOUNTER — Encounter (HOSPITAL_COMMUNITY): Payer: Self-pay

## 2024-03-30 MED ORDER — GABAPENTIN 400 MG PO CAPS
800.0000 mg | ORAL_CAPSULE | Freq: Every day | ORAL | 1 refills | Status: AC
  Filled 2024-03-30: qty 60, 30d supply, fill #0
  Filled 2024-04-25: qty 60, 30d supply, fill #1

## 2024-03-30 MED ORDER — AMPHETAMINE-DEXTROAMPHETAMINE 10 MG PO TABS
10.0000 mg | ORAL_TABLET | Freq: Three times a day (TID) | ORAL | 0 refills | Status: DC
  Filled 2024-03-31: qty 90, 30d supply, fill #0

## 2024-03-30 MED ORDER — VENLAFAXINE HCL ER 150 MG PO CP24
300.0000 mg | ORAL_CAPSULE | Freq: Every day | ORAL | 1 refills | Status: DC
  Filled 2024-03-30 – 2024-05-02 (×6): qty 60, 30d supply, fill #0

## 2024-03-30 MED ORDER — AMPHETAMINE-DEXTROAMPHETAMINE 10 MG PO TABS
10.0000 mg | ORAL_TABLET | Freq: Three times a day (TID) | ORAL | 0 refills | Status: DC
  Filled 2024-04-29 (×2): qty 90, 30d supply, fill #0

## 2024-03-31 ENCOUNTER — Other Ambulatory Visit (HOSPITAL_COMMUNITY): Payer: Self-pay

## 2024-03-31 ENCOUNTER — Other Ambulatory Visit: Payer: Self-pay

## 2024-04-01 ENCOUNTER — Ambulatory Visit: Admitting: Internal Medicine

## 2024-04-01 ENCOUNTER — Other Ambulatory Visit: Payer: Self-pay | Admitting: Licensed Clinical Social Worker

## 2024-04-01 NOTE — Patient Outreach (Signed)
 Complex Care Management   Visit Note  04/01/2024  Name:  Julian Richardson MRN: [id number] DOB: [birth year]  Situation: Referral received for Complex Care Management related to SDOH Barriers:  Housing *** Food insecurity Financial Resource Strain I obtained verbal consent from Patient.  Visit completed with Patient  on the phone  Background:   Past Medical History:  Diagnosis Date   ADHD (attention deficit hyperactivity disorder)    Anxiety    Gout    Hypertension    Sleep apnea     Assessment: Patient Reported Symptoms:  Cognitive        Neurological      HEENT        Cardiovascular      Respiratory      Endocrine      Gastrointestinal        Genitourinary      Integumentary      Musculoskeletal          Psychosocial            04/01/2024    PHQ2-9 Depression Screening   Little interest or pleasure in doing things    Feeling down, depressed, or hopeless    PHQ-2 - Total Score    Trouble falling or staying asleep, or sleeping too much    Feeling tired or having little energy    Poor appetite or overeating     Feeling bad about yourself - or that you are a failure or have let yourself or your family down    Trouble concentrating on things, such as reading the newspaper or watching television    Moving or speaking so slowly that other people could have noticed.  Or the opposite - being so fidgety or restless that you have been moving around a lot more than usual    Thoughts that you would be better off dead, or hurting yourself in some way    PHQ2-9 Total Score    If you checked off any problems, how difficult have these problems made it for you to do your work, take care of things at home, or get along with other people    Depression Interventions/Treatment      There were no vitals filed for this visit.    Medications Reviewed Today     Reviewed by Veva Bolt, LCSW (Social Worker) on 04/01/24 at 1119  Med List Status: <None>   Medication  Order Taking? Sig Documenting Provider Last Dose Status Informant  allopurinol  (ZYLOPRIM ) 100 MG tablet [id number]  Take 1 tablet (100 mg total) by mouth daily. Del Orbe Polanco, Hilario, FNP  Active   amLODipine  (NORVASC ) 10 MG tablet [id number]  Take 10 mg by mouth daily. [provider]  Active   amphetamine -dextroamphetamine  (ADDERALL) 10 MG tablet [id number]  Take 1 tablet (10 mg total) by mouth 3 (three) times daily (8AM, 12PM, 3PM) for ADHD.   Active   amphetamine -dextroamphetamine  (ADDERALL) 10 MG tablet [id number]  Take 1 tablet by mouth three times a day (8AM + 12 PM+ 3 PM) for ADHD   Active   Discontinued 01/16/14 0133 clonazePAM  (KLONOPIN ) 0.5 MG tablet [id number]  Take 1 tablet (0.5 mg total) by mouth daily as needed for anxiety. Bevely Doffing, FNP  Active   gabapentin  (NEURONTIN ) 400 MG capsule [id number]  Take 2 capsules (800 mg total) by mouth at bedtime for anxiety/sleep/mood.   Active   gabapentin  (NEURONTIN ) 400 MG capsule [id number]  Take 2 capsules (800 mg total) by mouth at bedtime for  anxiety/sleep/mood. Akintayo, Mojeed, MD  Active   methylPREDNISolone  acetate (DEPO-MEDROL ) injection 40 mg [id number]   Del Orbe Polanco, Iliana, FNP  Active   metoprolol  succinate (TOPROL -XL) 100 MG 24 hr tablet [id number]  Take 1 tablet (100 mg total) by mouth daily with or immediately following a meal. Del Wilhelmena Falter, Iliana, FNP  Active   nystatin  cream (MYCOSTATIN ) [id number]  Apply 1 Application topically 2 (two) times daily. Del Wilhelmena Falter, Hilario, FNP  Active   omeprazole  (PRILOSEC) 40 MG capsule [id number]  Take 1 capsule (40 mg total) by mouth daily. Del Orbe Polanco, Hilario, FNP  Active   spironolactone  (ALDACTONE ) 25 MG tablet [id number]  Take 1 tablet (25 mg total) by mouth 2 (two) times daily. Bevely Doffing, FNP  Active   tadalafil  (CIALIS ) 20 MG tablet [id number]  Take 1 tablet (20 mg total) by mouth as needed. Del Wilhelmena Falter, Hilario, FNP  Active   tamsulosin  (FLOMAX ) 0.4 MG CAPS  capsule [id number]  Take 1 capsule (0.4 mg total) by mouth daily. Del Wilhelmena Falter, Hilario, FNP  Active   tamsulosin  (FLOMAX ) 0.4 MG CAPS capsule [id number]  Take 1 capsule (0.4 mg total) by mouth daily. Del Wilhelmena Falter, Hilario, FNP  Active   Testosterone  Cypionate 200 MG/ML SOLN [id number]  Inject 0.5 mLs into the muscle every 7 (seven) days. Nieves Cough, MD  Active   tirzepatide  (ZEPBOUND ) 2.5 MG/0.5ML Pen [id number]  Inject 2.5 mg into the skin once a week. Bevely Doffing, FNP  Active   venlafaxine  XR (EFFEXOR  XR) 150 MG 24 hr capsule [id number]  Take 2 capsules (300 mg total) by mouth daily.   Active   venlafaxine  XR (EFFEXOR -XR) 150 MG 24 hr capsule [id number]  Take 2 capsules (300 mg total) by mouth daily. Del Wilhelmena Falter, Hilario, FNP  Active   Med List Note Lauralyn, Powell LITTIE Sola 10/28/13 1036): Patient states he has sleep apnea.             Recommendation:   PCP Follow-up Continue Current Plan of Care  Follow Up Plan:   Patient has met all care management goals. Care Management case will be closed. Patient has been provided contact information should new needs arise.   Cena Ligas, LCSW Clinical Social Worker [facility]

## 2024-04-02 ENCOUNTER — Other Ambulatory Visit (HOSPITAL_COMMUNITY): Payer: Self-pay

## 2024-04-05 ENCOUNTER — Ambulatory Visit: Admitting: Urology

## 2024-04-11 ENCOUNTER — Other Ambulatory Visit (HOSPITAL_COMMUNITY): Payer: Self-pay

## 2024-04-12 ENCOUNTER — Other Ambulatory Visit: Payer: Self-pay

## 2024-04-12 DIAGNOSIS — F411 Generalized anxiety disorder: Secondary | ICD-10-CM

## 2024-04-18 ENCOUNTER — Other Ambulatory Visit (HOSPITAL_COMMUNITY): Payer: Self-pay

## 2024-04-20 ENCOUNTER — Other Ambulatory Visit (HOSPITAL_COMMUNITY): Payer: Self-pay

## 2024-04-22 ENCOUNTER — Other Ambulatory Visit (HOSPITAL_COMMUNITY): Payer: Self-pay

## 2024-04-25 ENCOUNTER — Other Ambulatory Visit: Payer: Self-pay

## 2024-04-25 ENCOUNTER — Other Ambulatory Visit (HOSPITAL_COMMUNITY): Payer: Self-pay

## 2024-04-25 ENCOUNTER — Encounter (HOSPITAL_COMMUNITY): Payer: Self-pay

## 2024-04-26 ENCOUNTER — Other Ambulatory Visit: Payer: Self-pay

## 2024-04-27 ENCOUNTER — Other Ambulatory Visit (HOSPITAL_COMMUNITY): Payer: Self-pay

## 2024-04-28 ENCOUNTER — Other Ambulatory Visit: Payer: Self-pay | Admitting: Family Medicine

## 2024-04-29 ENCOUNTER — Encounter (HOSPITAL_COMMUNITY): Payer: Self-pay

## 2024-04-29 ENCOUNTER — Other Ambulatory Visit (HOSPITAL_COMMUNITY): Payer: Self-pay

## 2024-04-29 ENCOUNTER — Other Ambulatory Visit: Payer: Self-pay

## 2024-05-02 ENCOUNTER — Other Ambulatory Visit (HOSPITAL_COMMUNITY): Payer: Self-pay

## 2024-05-02 ENCOUNTER — Other Ambulatory Visit: Payer: Self-pay

## 2024-05-06 ENCOUNTER — Telehealth: Admitting: Licensed Clinical Social Worker

## 2024-05-17 ENCOUNTER — Other Ambulatory Visit (HOSPITAL_COMMUNITY): Payer: Self-pay

## 2024-05-18 ENCOUNTER — Encounter: Payer: Self-pay | Admitting: Family Medicine

## 2024-05-18 ENCOUNTER — Encounter (HOSPITAL_COMMUNITY): Payer: Self-pay

## 2024-05-20 ENCOUNTER — Other Ambulatory Visit (HOSPITAL_COMMUNITY): Payer: Self-pay

## 2024-05-23 ENCOUNTER — Other Ambulatory Visit (HOSPITAL_COMMUNITY): Payer: Self-pay

## 2024-05-24 ENCOUNTER — Other Ambulatory Visit (HOSPITAL_COMMUNITY): Payer: Self-pay

## 2024-05-24 ENCOUNTER — Telehealth: Payer: Self-pay

## 2024-05-24 NOTE — Telephone Encounter (Signed)
 Pharmacy Patient Advocate Encounter  Received notification from [employer] that Prior Authorization for Zepbound  2.5mg /0.24ml has been APPROVED from 04/28/24 to 04/27/25   PA #/Case ID/Reference #: [id number]

## 2024-05-24 NOTE — Telephone Encounter (Signed)
 Pharmacy Patient Advocate Encounter   Received notification from Patient Advice Request messages that prior authorization for Zepbound  2.5mg /0.25ml is required/requested.   Insurance verification completed.   The patient is insured through [employer] [place].   Per test claim: PA required; PA submitted to above mentioned insurance via Latent Key/confirmation #/EOC [id number] Status is pending

## 2024-05-25 ENCOUNTER — Ambulatory Visit (INDEPENDENT_AMBULATORY_CARE_PROVIDER_SITE_OTHER): Payer: Self-pay | Admitting: Nurse Practitioner

## 2024-05-25 ENCOUNTER — Encounter (HOSPITAL_COMMUNITY): Payer: Self-pay

## 2024-05-25 ENCOUNTER — Encounter: Payer: Self-pay | Admitting: Nurse Practitioner

## 2024-05-25 ENCOUNTER — Other Ambulatory Visit: Payer: Self-pay

## 2024-05-25 ENCOUNTER — Other Ambulatory Visit (HOSPITAL_COMMUNITY): Payer: Self-pay

## 2024-05-25 VITALS — BP 205/79 | HR 86 | Ht 72.0 in | Wt 352.0 lb

## 2024-05-25 DIAGNOSIS — K219 Gastro-esophageal reflux disease without esophagitis: Secondary | ICD-10-CM

## 2024-05-25 DIAGNOSIS — I10 Essential (primary) hypertension: Secondary | ICD-10-CM

## 2024-05-25 DIAGNOSIS — R7303 Prediabetes: Secondary | ICD-10-CM

## 2024-05-25 DIAGNOSIS — Z6841 Body Mass Index (BMI) 40.0 and over, adult: Secondary | ICD-10-CM

## 2024-05-25 DIAGNOSIS — G4733 Obstructive sleep apnea (adult) (pediatric): Secondary | ICD-10-CM

## 2024-05-25 MED ORDER — PANTOPRAZOLE SODIUM 40 MG PO TBEC: 40.0000 mg | DELAYED_RELEASE_TABLET | Freq: Every day | ORAL | 3 refills | Status: AC

## 2024-05-25 MED ORDER — AMPHETAMINE-DEXTROAMPHETAMINE 10 MG PO TABS
10.0000 mg | ORAL_TABLET | Freq: Three times a day (TID) | ORAL | 0 refills | Status: AC
  Filled 2024-05-27: qty 90, 30d supply, fill #0

## 2024-05-25 MED ORDER — TIRZEPATIDE 2.5 MG/0.5ML ~~LOC~~ SOAJ: 2.5000 mg | SUBCUTANEOUS | 2 refills | Status: AC

## 2024-05-25 MED ORDER — AMPHETAMINE-DEXTROAMPHETAMINE 10 MG PO TABS: 10.0000 mg | ORAL_TABLET | Freq: Three times a day (TID) | ORAL | 0 refills | Status: AC

## 2024-05-25 MED ORDER — ALLOPURINOL 100 MG PO TABS
100.0000 mg | ORAL_TABLET | Freq: Every day | ORAL | 1 refills | Status: AC
  Filled 2024-05-25: qty 90, 90d supply, fill #0

## 2024-05-25 MED ORDER — AMLODIPINE BESYLATE 10 MG PO TABS: 10.0000 mg | ORAL_TABLET | Freq: Every day | ORAL | 1 refills | Status: AC

## 2024-05-25 MED ORDER — VENLAFAXINE HCL ER 150 MG PO CP24
300.0000 mg | ORAL_CAPSULE | Freq: Every day | ORAL | 1 refills | Status: AC
  Filled 2024-05-25: qty 60, 30d supply, fill #0

## 2024-05-25 MED ORDER — GABAPENTIN 400 MG PO CAPS
800.0000 mg | ORAL_CAPSULE | Freq: Every day | ORAL | 1 refills | Status: AC
  Filled 2024-05-25: qty 60, 30d supply, fill #0

## 2024-05-25 NOTE — Progress Notes (Signed)
 "  Subjective:    Patient ID: Julian Richardson, male    DOB: 1970-12-06, 54 y.o.   MRN: [id number]   Chief Complaint: stomach issues  Abdominal Pain Pertinent negatives include no headaches.  Back Pain Associated symptoms include abdominal pain. Pertinent negatives include no chest pain, headaches or weakness.   Patient in today with 2 complaints: - stomach- he has been on zepbound  for 2 weeks. Stomach has been bothering him. Gnawing pain. He has been taking omeprazole  which has helped.  He was on mounjario 7.5mg  and had no issues with  that.  - back pain- mid back pain. Started about 2 weeks. Rates pain 7-8/10 today. Burning gnawing type of pain. Waking him up at night.   Has sleep apnea and moujario was helping with sleep apnea Lab Results  Component Value Date   HGBA1C 5.8 (H) 03/15/2024   Hypertension Blood pressure is really high- they recently stopped his lisinopril. He is out of amlodipine . So has only had metoprolol  and spirolactone daily.  Patient Active Problem List   Diagnosis Date Noted   Tooth pain 04/17/2023   Acute gout of left knee 02/03/2023   Anxiety 02/03/2023   Morbid obesity (HCC) 08/29/2021   Hypertension 08/29/2021   Prediabetes 08/29/2021   Mixed hyperlipidemia 08/29/2021   Vitamin D  deficiency 08/29/2021   ADD (attention deficit disorder) without hyperactivity 10/17/2014   Depression 10/17/2014   GAD (generalized anxiety disorder) 10/17/2014   Gout, unspecified 10/11/2014   OSA (obstructive sleep apnea) 10/11/2014   Gastric bypass status for obesity 09/19/2014       Review of Systems  Constitutional:  Negative for diaphoresis.  Eyes:  Negative for pain.  Respiratory:  Negative for shortness of breath.   Cardiovascular:  Negative for chest pain, palpitations and leg swelling.  Gastrointestinal:  Positive for abdominal pain.  Endocrine: Negative for polydipsia.  Musculoskeletal:  Positive for back pain.  Skin:  Negative for rash.  Neurological:   Negative for dizziness, weakness and headaches.  Hematological:  Does not bruise/bleed easily.  All other systems reviewed and are negative.      Objective:   Physical Exam Constitutional:      Appearance: Normal appearance. He is obese.  Cardiovascular:     Rate and Rhythm: Normal rate and regular rhythm.     Heart sounds: Normal heart sounds.  Pulmonary:     Effort: Pulmonary effort is normal.     Breath sounds: Normal breath sounds.  Abdominal:     General: Bowel sounds are normal.     Palpations: Abdomen is soft.     Tenderness: There is abdominal tenderness (mild).  Skin:    General: Skin is warm.  Neurological:     General: No focal deficit present.     Mental Status: He is alert and oriented to person, place, and time. Mental status is at baseline.  Psychiatric:        Mood and Affect: Mood normal.        Behavior: Behavior normal.     BP (!) 205/79   Pulse 86   Ht 6' (1.829 m)   Wt (!) 352 lb (159.7 kg)   SpO2 97%   BMI 47.74 kg/m        Assessment & Plan:   Julian Richardson in today with chief complaint of Abdominal Pain (For one week ) and Back Pain (For one week )   1. Obstructive sleep apnea (Primary) Doe snot have CPAP machine Trying to get back on  mounjario - tirzepatide  (MOUNJARO ) 2.5 MG/0.5ML Pen; Inject 2.5 mg into the skin once a week.  Dispense: 2 mL; Refill: 2  2. Prediabetes Low carb diet - tirzepatide  (MOUNJARO ) 2.5 MG/0.5ML Pen; Inject 2.5 mg into the skin once a week.  Dispense: 2 mL; Refill: 2  3. Gastroesophageal reflux disease without esophagitis Stop omeprazole  and start on protonix  Avoid spicy foods Do not eat 2 hours prior to bedtime - pantoprazole  (PROTONIX ) 40 MG tablet; Take 1 tablet (40 mg total) by mouth daily.  Dispense: 30 tablet; Refill: 3  4. Primary hypertension Back on amlodipine  Keep diary of blood pressure at home for 2 weeks - amLODipine  (NORVASC ) 10 MG tablet; Take 1 tablet (10 mg total) by mouth daily.   Dispense: 90 tablet; Refill: 1  5. Morbid obesity (HCC) Trying to get patient back on mounjario    The above assessment and management plan was discussed with the patient. The patient verbalized understanding of and has agreed to the management plan. Patient is aware to call the clinic if symptoms persist or worsen. Patient is aware when to return to the clinic for a follow-up visit. Patient educated on when it is appropriate to go to the emergency department.   Mary-Margaret Gladis, FNP   "

## 2024-05-25 NOTE — Patient Instructions (Signed)
 GERD in Adults: Diet Changes When you have gastroesophageal reflux disease (GERD), you may need to make changes to your diet. Choosing the right foods can help with your symptoms. Think about working with an expert in healthy eating called a dietitian. They can help you make healthy food choices. What are tips for following this plan? Reading food labels Look for foods that are low in saturated fat. Foods that may help with your symptoms include: Foods with less than 5% of daily value (DV) of fat. Foods with 0 grams of trans fat. Cooking [employer] in ways that don't use a lot of fat. These ways include: Baking. Steaming. Grilling. Broiling. To add flavor, try to use herbs that are low in spice and acidity. Avoid frying your food. Meal planning  Eat small meals often rather than eating 3 large meals each day. Eat your meals slowly in a place where you feel relaxed. If told by your health care provider, avoid: Foods that cause symptoms. Keep a food diary to keep track of foods that cause symptoms. Alcohol. Drinking a lot of liquid with meals. General instructions For 2-3 hours after you eat, avoid: Bending over. Exercise. Lying down. Chew sugar-free gum after meals. What foods should I eat? Eat a healthy diet. Try to include: Foods with high amounts of fiber. These include: Fruits and vegetables. Whole grains and beans. Low-fat dairy products. Lean meats, fish, and poultry. Egg whites. Foods that cause symptoms in someone else may not cause symptoms for you. Work with your provider to find foods that are safe for you. The items listed above may not be all the foods and drinks you can have. Talk with a dietitian to learn more. The items listed above may not be a complete list of foods and beverages you can eat and drink. Contact a dietitian for more information. What foods should I avoid? Limiting some of these foods may help with your symptoms. Each person is different.  Talk with a dietitian or your provider to help you find the exact foods to avoid. Some of the foods to avoid may include: Fruits Fruits with a lot of acid in them. These may include citrus fruits, such as oranges, grapefruit, pineapple, and lemons. Vegetables Deep-fried vegetables, such as [place] fries. Vegetables, sauces, or toppings made with added fat and vegetables with acid in them. These may include tomatoes and tomato products, chili peppers, onions, garlic, and horseradish. Grains Pastries or quick breads with added fat. Meats and other proteins High-fat meats, such as fatty beef or pork, hot dogs, ribs, ham, sausage, salami, and bacon. Fried meat or protein, such as fried fish and fried chicken. Egg yolks. Fats and oils Butter. Margarine. Shortening. Ghee. Drinks Coffee and other drinks with caffeine in them. Fizzy and sugary drinks, such as soda and energy drinks. Fruit juice made with acidic fruits, such as orange or grapefruit. Tomato juice. Sweets and desserts Chocolate and cocoa. Donuts. Seasonings and condiments Mint, such as peppermint and spearmint. Condiments, herbs, or seasonings that cause symptoms. These may include curry, hot sauce, or vinegar-based salad dressings. The items listed above may not be all the foods and drinks you should avoid. Talk with a dietitian to learn more. Questions to ask your health care provider Changes to your diet and everyday life are often the first steps taken to manage symptoms of GERD. If these changes don't help, talk with your provider about taking medicines. Where to find more information [employer] G[employer]intestinal [employer]:  aboutgerd.org This information is not intended to replace advice given to you by your health care provider. Make sure you discuss any questions you have with your health care provider. Document Revised: 02/24/2023 Document Reviewed: 09/10/2022 Elsevier Patient Education  2024 [employer].

## 2024-05-27 ENCOUNTER — Other Ambulatory Visit: Payer: Self-pay

## 2024-05-27 ENCOUNTER — Encounter (HOSPITAL_COMMUNITY): Payer: Self-pay

## 2024-05-27 ENCOUNTER — Other Ambulatory Visit (HOSPITAL_COMMUNITY): Payer: Self-pay

## 2024-05-28 ENCOUNTER — Other Ambulatory Visit (HOSPITAL_COMMUNITY): Payer: Self-pay

## 2024-05-30 ENCOUNTER — Other Ambulatory Visit (HOSPITAL_COMMUNITY): Payer: Self-pay

## 2024-05-31 ENCOUNTER — Other Ambulatory Visit: Payer: Self-pay

## 2024-06-15 ENCOUNTER — Ambulatory Visit: Payer: Self-pay | Admitting: Nurse Practitioner

## 2024-07-05 ENCOUNTER — Ambulatory Visit

## 2024-07-11 ENCOUNTER — Ambulatory Visit: Admitting: Urology

## 2024-09-14 ENCOUNTER — Ambulatory Visit
